# Patient Record
Sex: Female | Born: 1985 | Race: Black or African American | Hispanic: No | Marital: Single | Smoking: Current every day smoker
Health system: Southern US, Community
[De-identification: ages and names within clinical notes are randomized; demographics above are authoritative.]

## PROBLEM LIST (undated history)

## (undated) HISTORY — PX: TUBAL LIGATION: SHX77

---

## 2010-02-18 ENCOUNTER — Emergency Department (HOSPITAL_COMMUNITY): Admission: EM | Admit: 2010-02-18 | Discharge: 2010-02-18 | Payer: Self-pay | Admitting: Emergency Medicine

## 2011-03-13 LAB — URINALYSIS, ROUTINE W REFLEX MICROSCOPIC
Bilirubin Urine: NEGATIVE
Ketones, ur: NEGATIVE mg/dL
Protein, ur: NEGATIVE mg/dL
Urobilinogen, UA: 0.2 mg/dL (ref 0.0–1.0)
pH: 6 (ref 5.0–8.0)

## 2011-03-13 LAB — URINE MICROSCOPIC-ADD ON

## 2011-03-13 LAB — POCT PREGNANCY, URINE: Preg Test, Ur: POSITIVE

## 2014-01-03 ENCOUNTER — Encounter (HOSPITAL_COMMUNITY): Payer: Self-pay | Admitting: Emergency Medicine

## 2014-01-03 ENCOUNTER — Emergency Department (HOSPITAL_COMMUNITY)
Admission: EM | Admit: 2014-01-03 | Discharge: 2014-01-03 | Disposition: A | Discharge status: Home / Self Care | Payer: Medicaid Other | Attending: Emergency Medicine | Admitting: Emergency Medicine

## 2014-01-03 DIAGNOSIS — Z331 Pregnant state, incidental: Secondary | ICD-10-CM | POA: Insufficient documentation

## 2014-01-03 DIAGNOSIS — R51 Headache: Secondary | ICD-10-CM | POA: Insufficient documentation

## 2014-01-03 DIAGNOSIS — O26899 Other specified pregnancy related conditions, unspecified trimester: Secondary | ICD-10-CM

## 2014-01-03 LAB — POCT I-STAT, CHEM 8
BUN: 7 mg/dL (ref 6–23)
CALCIUM ION: 1.25 mmol/L — AB (ref 1.12–1.23)
CREATININE: 0.8 mg/dL (ref 0.50–1.10)
Chloride: 101 mEq/L (ref 96–112)
GLUCOSE: 75 mg/dL (ref 70–99)
HEMATOCRIT: 39 % (ref 36.0–46.0)
Hemoglobin: 13.3 g/dL (ref 12.0–15.0)
POTASSIUM: 3.5 meq/L — AB (ref 3.7–5.3)
SODIUM: 138 meq/L (ref 137–147)
TCO2: 25 mmol/L (ref 0–100)

## 2014-01-03 LAB — URINALYSIS, ROUTINE W REFLEX MICROSCOPIC
Bilirubin Urine: NEGATIVE
Glucose, UA: NEGATIVE mg/dL
HGB URINE DIPSTICK: NEGATIVE
KETONES UR: NEGATIVE mg/dL
Nitrite: NEGATIVE
PROTEIN: NEGATIVE mg/dL
SPECIFIC GRAVITY, URINE: 1.023 (ref 1.005–1.030)
Urobilinogen, UA: 1 mg/dL (ref 0.0–1.0)
pH: 7 (ref 5.0–8.0)

## 2014-01-03 LAB — URINE MICROSCOPIC-ADD ON

## 2014-01-03 LAB — POCT PREGNANCY, URINE: Preg Test, Ur: POSITIVE — AB

## 2014-01-03 MED ORDER — PRENATAL COMPLETE 14-0.4 MG PO TABS: 1.0000 | ORAL_TABLET | Freq: Every day | ORAL | Status: DC

## 2014-01-03 MED ORDER — ACETAMINOPHEN 325 MG PO TABS
650.0000 mg | ORAL_TABLET | Freq: Once | ORAL | Status: AC
  Administered 2014-01-03: 650 mg via ORAL
  Filled 2014-01-03: qty 2

## 2014-01-03 NOTE — ED Notes (Addendum)
Pt presents to department for evaluation of headache. Ongoing x3 days. Also states nausea/vomiting. Pt is alert and oriented x4. 8/10 pain at the time. Pt is alert and oriented x4. No signs of acute distress noted. Pt states she is x2 months pregnant.

## 2014-01-03 NOTE — Discharge Instructions (Signed)
Please continue to followup with an OB/GYN specialist for continued evaluation and care during your pregnancy. It is recommended that you use prenatal vitamins.     Headaches, Frequently Asked Questions MIGRAINE HEADACHES Q: What is migraine? What causes it? How can I treat it? A: Generally, migraine headaches begin as a dull ache. Then they develop into a constant, throbbing, and pulsating pain. You may experience pain at the temples. You may experience pain at the front or back of one or both sides of the head. The pain is usually accompanied by a combination of:  Nausea.  Vomiting.  Sensitivity to light and noise. Some people (about 15%) experience an aura (see below) before an attack. The cause of migraine is believed to be chemical reactions in the brain. Treatment for migraine may include over-the-counter or prescription medications. It may also include self-help techniques. These include relaxation training and biofeedback.  Q: What is an aura? A: About 15% of people with migraine get an "aura". This is a sign of neurological symptoms that occur before a migraine headache. You may see wavy or jagged lines, dots, or flashing lights. You might experience tunnel vision or blind spots in one or both eyes. The aura can include visual or auditory hallucinations (something imagined). It may include disruptions in smell (such as strange odors), taste or touch. Other symptoms include:  Numbness.  A "pins and needles" sensation.  Difficulty in recalling or speaking the correct word. These neurological events may last as long as 60 minutes. These symptoms will fade as the headache begins. Q: What is a trigger? A: Certain physical or environmental factors can lead to or "trigger" a migraine. These include:  Foods.  Hormonal changes.  Weather.  Stress. It is important to remember that triggers are different for everyone. To help prevent migraine attacks, you need to figure out which  triggers affect you. Keep a headache diary. This is a good way to track triggers. The diary will help you talk to your healthcare professional about your condition. Q: Does weather affect migraines? A: Bright sunshine, hot, humid conditions, and drastic changes in barometric pressure may lead to, or "trigger," a migraine attack in some people. But studies have shown that weather does not act as a trigger for everyone with migraines. Q: What is the link between migraine and hormones? A: Hormones start and regulate many of your body's functions. Hormones keep your body in balance within a constantly changing environment. The levels of hormones in your body are unbalanced at times. Examples are during menstruation, pregnancy, or menopause. That can lead to a migraine attack. In fact, about three quarters of all women with migraine report that their attacks are related to the menstrual cycle.  Q: Is there an increased risk of stroke for migraine sufferers? A: The likelihood of a migraine attack causing a stroke is very remote. That is not to say that migraine sufferers cannot have a stroke associated with their migraines. In persons under age 28, the most common associated factor for stroke is migraine headache. But over the course of a person's normal life span, the occurrence of migraine headache may actually be associated with a reduced risk of dying from cerebrovascular disease due to stroke.  Q: What are acute medications for migraine? A: Acute medications are used to treat the pain of the headache after it has started. Examples over-the-counter medications, NSAIDs, ergots, and triptans.  Q: What are the triptans? A: Triptans are the newest class of abortive medications.  They are specifically targeted to treat migraine. Triptans are vasoconstrictors. They moderate some chemical reactions in the brain. The triptans work on receptors in your brain. Triptans help to restore the balance of a neurotransmitter  called serotonin. Fluctuations in levels of serotonin are thought to be a main cause of migraine.  Q: Are over-the-counter medications for migraine effective? A: Over-the-counter, or "OTC," medications may be effective in relieving mild to moderate pain and associated symptoms of migraine. But you should see your caregiver before beginning any treatment regimen for migraine.  Q: What are preventive medications for migraine? A: Preventive medications for migraine are sometimes referred to as "prophylactic" treatments. They are used to reduce the frequency, severity, and length of migraine attacks. Examples of preventive medications include antiepileptic medications, antidepressants, beta-blockers, calcium channel blockers, and NSAIDs (nonsteroidal anti-inflammatory drugs). Q: Why are anticonvulsants used to treat migraine? A: During the past few years, there has been an increased interest in antiepileptic drugs for the prevention of migraine. They are sometimes referred to as "anticonvulsants". Both epilepsy and migraine may be caused by similar reactions in the brain.  Q: Why are antidepressants used to treat migraine? A: Antidepressants are typically used to treat people with depression. They may reduce migraine frequency by regulating chemical levels, such as serotonin, in the brain.  Q: What alternative therapies are used to treat migraine? A: The term "alternative therapies" is often used to describe treatments considered outside the scope of conventional Western medicine. Examples of alternative therapy include acupuncture, acupressure, and yoga. Another common alternative treatment is herbal therapy. Some herbs are believed to relieve headache pain. Always discuss alternative therapies with your caregiver before proceeding. Some herbal products contain arsenic and other toxins. TENSION HEADACHES Q: What is a tension-type headache? What causes it? How can I treat it? A: Tension-type headaches occur  randomly. They are often the result of temporary stress, anxiety, fatigue, or anger. Symptoms include soreness in your temples, a tightening band-like sensation around your head (a "vice-like" ache). Symptoms can also include a pulling feeling, pressure sensations, and contracting head and neck muscles. The headache begins in your forehead, temples, or the back of your head and neck. Treatment for tension-type headache may include over-the-counter or prescription medications. Treatment may also include self-help techniques such as relaxation training and biofeedback. CLUSTER HEADACHES Q: What is a cluster headache? What causes it? How can I treat it? A: Cluster headache gets its name because the attacks come in groups. The pain arrives with little, if any, warning. It is usually on one side of the head. A tearing or bloodshot eye and a runny nose on the same side of the headache may also accompany the pain. Cluster headaches are believed to be caused by chemical reactions in the brain. They have been described as the most severe and intense of any headache type. Treatment for cluster headache includes prescription medication and oxygen. SINUS HEADACHES Q: What is a sinus headache? What causes it? How can I treat it? A: When a cavity in the bones of the face and skull (a sinus) becomes inflamed, the inflammation will cause localized pain. This condition is usually the result of an allergic reaction, a tumor, or an infection. If your headache is caused by a sinus blockage, such as an infection, you will probably have a fever. An x-ray will confirm a sinus blockage. Your caregiver's treatment might include antibiotics for the infection, as well as antihistamines or decongestants.  REBOUND HEADACHES Q: What is a rebound  headache? What causes it? How can I treat it? A: A pattern of taking acute headache medications too often can lead to a condition known as "rebound headache." A pattern of taking too much  headache medication includes taking it more than 2 days per week or in excessive amounts. That means more than the label or a caregiver advises. With rebound headaches, your medications not only stop relieving pain, they actually begin to cause headaches. Doctors treat rebound headache by tapering the medication that is being overused. Sometimes your caregiver will gradually substitute a different type of treatment or medication. Stopping may be a challenge. Regularly overusing a medication increases the potential for serious side effects. Consult a caregiver if you regularly use headache medications more than 2 days per week or more than the label advises. ADDITIONAL QUESTIONS AND ANSWERS Q: What is biofeedback? A: Biofeedback is a self-help treatment. Biofeedback uses special equipment to monitor your body's involuntary physical responses. Biofeedback monitors:  Breathing.  Pulse.  Heart rate.  Temperature.  Muscle tension.  Brain activity. Biofeedback helps you refine and perfect your relaxation exercises. You learn to control the physical responses that are related to stress. Once the technique has been mastered, you do not need the equipment any more. Q: Are headaches hereditary? A: Four out of five (80%) of people that suffer report a family history of migraine. Scientists are not sure if this is genetic or a family predisposition. Despite the uncertainty, a child has a 50% chance of having migraine if one parent suffers. The child has a 75% chance if both parents suffer.  Q: Can children get headaches? A: By the time they reach high school, most young people have experienced some type of headache. Many safe and effective approaches or medications can prevent a headache from occurring or stop it after it has begun.  Q: What type of doctor should I see to diagnose and treat my headache? A: Start with your primary caregiver. Discuss his or her experience and approach to headaches. Discuss  methods of classification, diagnosis, and treatment. Your caregiver may decide to recommend you to a headache specialist, depending upon your symptoms or other physical conditions. Having diabetes, allergies, etc., may require a more comprehensive and inclusive approach to your headache. The National Head[employer] Foundation will provide, upon request, a list of [facility] physician members in your state. Document Released: 02/28/2004 Document Revised: 03/01/2012 Document Reviewed: 08/07/2008 [facility] Patient Information 2014 [place], [place].

## 2014-01-03 NOTE — ED Notes (Addendum)
Pt reports generalized weakness with HA x several days. States she just doesn't have any energy. Denies fever/chills/body aches/ cough/ V.  States she has mild nausea in the AM; reports hx of morning sickness. NAD. Rates HA as 8/10, no sensitivity to light or sound. Neuro intact. VSS.

## 2014-01-03 NOTE — ED Provider Notes (Signed)
CSN: [id number]     Arrival date & time 01/03/14  1849 History   First MD Initiated Contact with Patient 01/03/14 2109     Chief Complaint  Patient presents with  . Headache   HPI  History provided by the patient. Patient is a 28 year old female who reports currently being 2 months pregnant and presents with complaints of continued frontal headache. Patient states she has had a headache to the forehead area for the past 3-4 days. She has been taking Tylenol and reports only brief momentary relief of headache symptoms. She also finds some relief with certain positions and resting of the headache. Pain is described as mild. It is a throbbing pain. Patient does report having similar headaches with previous pregnancies but never lasting this long. She also complains of some increased general fatigue. Denies having any abdominal pains, vaginal bleeding or vaginal discharge. No urinary symptoms. No recent cough or cold symptoms. No fever, chills or sweats.    History reviewed. No pertinent past medical history. History reviewed. No pertinent past surgical history. No family history on file. History  Substance Use Topics  . Smoking status: Never Smoker   . Smokeless tobacco: Not on file  . Alcohol Use: No   OB History   Grav Para Term Preterm Abortions TAB SAB Ect Mult Living                 Review of Systems  Constitutional: Positive for fatigue.  Eyes: Negative for photophobia and visual disturbance.  Gastrointestinal: Negative for nausea, vomiting and abdominal pain.  Genitourinary: Negative for dysuria, frequency, hematuria, flank pain, vaginal bleeding and vaginal discharge.  Neurological: Positive for headaches. Negative for dizziness and light-headedness.  All other systems reviewed and are negative.    Allergies  Review of patient's allergies indicates no known allergies.  Home Medications  No current outpatient prescriptions on file. BP 136/75  Pulse 77  Temp(Src) 97.9 F  (36.6 C) (Oral)  Resp 16  SpO2 100% Physical Exam  Nursing note and vitals reviewed. Constitutional: She is oriented to person, place, and time. She appears well-developed and well-nourished. No distress.  HENT:  Head: Normocephalic and atraumatic.  Eyes: Conjunctivae and EOM are normal. Pupils are equal, round, and reactive to light.  Neck: Normal range of motion. Neck supple.  No meningeal signs  Cardiovascular: Normal rate and regular rhythm.   No murmur heard. Pulmonary/Chest: Effort normal and breath sounds normal. No respiratory distress. She has no wheezes. She has no rales.  Abdominal: Soft. There is no tenderness. There is no rigidity, no rebound, no guarding, no CVA tenderness and no tenderness at McBurney's point.  Neurological: She is alert and oriented to person, place, and time. She has normal strength. No cranial nerve deficit or sensory deficit. Gait normal.  Skin: Skin is warm and dry. No rash noted.  Psychiatric: She has a normal mood and affect. Her behavior is normal.    ED Course  Procedures   DIAGNOSTIC STUDIES: Oxygen Saturation is 98% on room air.    COORDINATION OF CARE:  Nursing notes reviewed. Vital signs reviewed. Initial pt interview and examination performed.   10:33 PM-patient seen and evaluated. Patient is well-appearing in no acute distress. Normal nonfocal neuro exam. Patient does report being 2 months pregnant. She has had headaches with her previous pregnancies. This was gradual and persistent onset. Do not suspect any concern or emerging condition causing her headache at this time.    Treatment plan initiated: Medications  acetaminophen (TYLENOL) tablet 650 mg (not administered)   Results for orders placed during the hospital encounter of 01/03/14  URINALYSIS, ROUTINE W REFLEX MICROSCOPIC      Result Value Range   Color, Urine YELLOW  YELLOW   APPearance CLOUDY (*) CLEAR   Specific Gravity, Urine 1.023  1.005 - 1.030   pH 7.0  5.0 - 8.0    Glucose, UA NEGATIVE  NEGATIVE mg/dL   Hgb urine dipstick NEGATIVE  NEGATIVE   Bilirubin Urine NEGATIVE  NEGATIVE   Ketones, ur NEGATIVE  NEGATIVE mg/dL   Protein, ur NEGATIVE  NEGATIVE mg/dL   Urobilinogen, UA 1.0  0.0 - 1.0 mg/dL   Nitrite NEGATIVE  NEGATIVE   Leukocytes, UA SMALL (*) NEGATIVE  URINE MICROSCOPIC-ADD ON      Result Value Range   Squamous Epithelial / LPF MANY (*) RARE   WBC, UA 0-2  <3 WBC/hpf   Bacteria, UA MANY (*) RARE  POCT PREGNANCY, URINE      Result Value Range   Preg Test, Ur POSITIVE (*) NEGATIVE  POCT I-STAT, CHEM 8      Result Value Range   Sodium 138  137 - 147 mEq/L   Potassium 3.5 (*) 3.7 - 5.3 mEq/L   Chloride 101  96 - 112 mEq/L   BUN 7  6 - 23 mg/dL   Creatinine, Ser 1.61  0.50 - 1.10 mg/dL   Glucose, Bld 75  70 - 99 mg/dL   Calcium, Ion 0.96 (*) 1.12 - 1.23 mmol/L   TCO2 25  0 - 100 mmol/L   Hemoglobin 13.3  12.0 - 15.0 g/dL   HCT 04.5  40.9 - 81.1 %     MDM   1. Headache in pregnancy        Angus Seller, PA-C 01/03/14 2245

## 2014-01-05 LAB — URINE CULTURE

## 2014-01-07 NOTE — ED Provider Notes (Signed)
Medical screening examination/treatment/procedure(s) were performed by non-physician practitioner and as supervising physician I was immediately available for consultation/collaboration.  EKG Interpretation   None         Paras Kreider, MD 01/07/14 0705 

## 2016-05-25 ENCOUNTER — Emergency Department (HOSPITAL_BASED_OUTPATIENT_CLINIC_OR_DEPARTMENT_OTHER)
Admission: EM | Admit: 2016-05-25 | Discharge: 2016-05-25 | Disposition: A | Discharge status: Home / Self Care | Payer: Self-pay | Attending: Emergency Medicine | Admitting: Emergency Medicine

## 2016-05-25 ENCOUNTER — Emergency Department (HOSPITAL_BASED_OUTPATIENT_CLINIC_OR_DEPARTMENT_OTHER): Payer: Self-pay

## 2016-05-25 ENCOUNTER — Encounter (HOSPITAL_BASED_OUTPATIENT_CLINIC_OR_DEPARTMENT_OTHER): Payer: Self-pay | Admitting: *Deleted

## 2016-05-25 DIAGNOSIS — M549 Dorsalgia, unspecified: Secondary | ICD-10-CM

## 2016-05-25 DIAGNOSIS — Z79899 Other long term (current) drug therapy: Secondary | ICD-10-CM | POA: Insufficient documentation

## 2016-05-25 DIAGNOSIS — F17200 Nicotine dependence, unspecified, uncomplicated: Secondary | ICD-10-CM | POA: Insufficient documentation

## 2016-05-25 DIAGNOSIS — M546 Pain in thoracic spine: Secondary | ICD-10-CM | POA: Insufficient documentation

## 2016-05-25 DIAGNOSIS — R079 Chest pain, unspecified: Secondary | ICD-10-CM | POA: Insufficient documentation

## 2016-05-25 LAB — URINALYSIS, ROUTINE W REFLEX MICROSCOPIC
BILIRUBIN URINE: NEGATIVE
Glucose, UA: NEGATIVE mg/dL
Hgb urine dipstick: NEGATIVE
KETONES UR: NEGATIVE mg/dL
LEUKOCYTES UA: NEGATIVE
NITRITE: NEGATIVE
PH: 6 (ref 5.0–8.0)
PROTEIN: NEGATIVE mg/dL
Specific Gravity, Urine: 1.023 (ref 1.005–1.030)

## 2016-05-25 LAB — BASIC METABOLIC PANEL
Anion gap: 6 (ref 5–15)
BUN: 11 mg/dL (ref 6–20)
CHLORIDE: 109 mmol/L (ref 101–111)
CO2: 22 mmol/L (ref 22–32)
CREATININE: 0.75 mg/dL (ref 0.44–1.00)
Calcium: 8.3 mg/dL — ABNORMAL LOW (ref 8.9–10.3)
GFR calc Af Amer: >60 mL/min (ref >60)
GFR calc non Af Amer: >60 mL/min (ref >60)
GLUCOSE: 95 mg/dL (ref 65–99)
POTASSIUM: 3.6 mmol/L (ref 3.5–5.1)
SODIUM: 137 mmol/L (ref 135–145)

## 2016-05-25 LAB — CBC WITH DIFFERENTIAL/PLATELET
BASOS ABS: 0 10*3/uL (ref 0.0–0.1)
BASOS PCT: 0 %
EOS ABS: 0.1 10*3/uL (ref 0.0–0.7)
EOS PCT: 1 %
HCT: 35.7 % — ABNORMAL LOW (ref 36.0–46.0)
Hemoglobin: 11.4 g/dL — ABNORMAL LOW (ref 12.0–15.0)
Lymphocytes Relative: 26 %
Lymphs Abs: 1.6 10*3/uL (ref 0.7–4.0)
MCH: 29.4 pg (ref 26.0–34.0)
MCHC: 31.9 g/dL (ref 30.0–36.0)
MCV: 92 fL (ref 78.0–100.0)
MONO ABS: 0.8 10*3/uL (ref 0.1–1.0)
Monocytes Relative: 13 %
Neutro Abs: 3.9 10*3/uL (ref 1.7–7.7)
Neutrophils Relative %: 60 %
PLATELETS: 222 10*3/uL (ref 150–400)
RBC: 3.88 MIL/uL (ref 3.87–5.11)
RDW: 13.9 % (ref 11.5–15.5)
WBC: 6.4 10*3/uL (ref 4.0–10.5)

## 2016-05-25 LAB — TROPONIN I

## 2016-05-25 LAB — PREGNANCY, URINE: PREG TEST UR: NEGATIVE

## 2016-05-25 MED ORDER — ACETAMINOPHEN 325 MG PO TABS
650.0000 mg | ORAL_TABLET | Freq: Once | ORAL | Status: AC
  Administered 2016-05-25: 650 mg via ORAL
  Filled 2016-05-25: qty 2

## 2016-05-25 MED ORDER — NAPROXEN 250 MG PO TABS: 250.0000 mg | ORAL_TABLET | Freq: Two times a day (BID) | ORAL | Status: AC

## 2016-05-25 NOTE — ED Provider Notes (Signed)
CSN: [id number]     Arrival date & time 05/25/16  1030 History   First MD Initiated Contact with Patient 05/25/16 1044     Chief Complaint  Patient presents with  . Back Pain    Kari Reed is a 30 y.o. female who presents to the ED complaining of left upper back pain that radiates to her left chest since last night. The patient reports she began having pain around 11 PM last night. She reports her pain has been constant since then. She currently complains of 7 out of 10 pain that is worse with movement of her left arm as well as laying on her right side. She denies any shortness of breath. She reports taking aspirin last night for treatment of her symptoms without relief. She has had nothing for treatment of her symptoms today. She denies any falls or trauma to her back. No personal or close family history of MI, PE or DVT. No recent long travel. No history of hyperlipidemia or hypertension. She is a current smoker. She denies fevers, coughing, shortness of breath, palpitations, leg pain, leg swelling, abdominal pain, nausea, vomiting, diarrhea, urinary symptoms, rashes, falls or trauma to her back.  Patient is a 30 y.o. female presenting with back pain. The history is provided by the patient. No language interpreter was used.  Back Pain Associated symptoms: chest pain   Associated symptoms: no abdominal pain, no dysuria, no fever, no headaches, no numbness and no weakness     History reviewed. No pertinent past medical history. Past Surgical History  Procedure Laterality Date  . Cesarean section  2011, 2012, 2015   No family history on file. Social History  Substance Use Topics  . Smoking status: Current Every Day Smoker  . Smokeless tobacco: Never Used  . Alcohol Use: No   OB History    No data available     Review of Systems  Constitutional: Negative for fever and chills.  HENT: Negative for congestion and sore throat.   Eyes: Negative for visual disturbance.  Respiratory:  Negative for cough, shortness of breath and wheezing.   Cardiovascular: Positive for chest pain. Negative for palpitations and leg swelling.  Gastrointestinal: Negative for nausea, vomiting, abdominal pain and diarrhea.  Genitourinary: Negative for dysuria, urgency, frequency, hematuria, decreased urine volume and difficulty urinating.  Musculoskeletal: Positive for back pain. Negative for gait problem and neck pain.  Skin: Negative for rash.  Neurological: Negative for dizziness, weakness, light-headedness, numbness and headaches.      Allergies  Review of patient's allergies indicates no known allergies.  Home Medications   Prior to Admission medications   Medication Sig Start Date End Date Taking? Authorizing Provider  Prenatal Vit-Fe Fumarate-FA (PRENATAL COMPLETE) 14-0.4 MG TABS Take 1 tablet by mouth daily. 01/03/14  Yes Peter Dammen, PA-C  naproxen (NAPROSYN) 250 MG tablet Take 1 tablet (250 mg total) by mouth 2 (two) times daily with a meal. 05/25/16   Everlene Farrier, PA-C   BP 136/83 mmHg  Pulse 88  Temp(Src) 98.5 F (36.9 C) (Oral)  Resp 20  Ht  (1.651 m)  Wt 84.823 kg  BMI 31.12 kg/m2  SpO2 100%  LMP 04/30/2016 (Exact Date) Physical Exam  Constitutional: She appears well-developed and well-nourished. No distress.  Nontoxic appearing.  HENT:  Head: Normocephalic and atraumatic.  Mouth/Throat: Oropharynx is clear and moist.  Eyes: Conjunctivae are normal. Pupils are equal, round, and reactive to light. Right eye exhibits no discharge. Left eye exhibits no discharge.  Neck: Normal range of motion. Neck supple. No JVD present. No tracheal deviation present.  Cardiovascular: Normal rate, regular rhythm, normal heart sounds and intact distal pulses.  Exam reveals no gallop and no friction rub.   No murmur heard. Bilateral radial, posterior tibialis and dorsalis pedis pulses are intact.    Pulmonary/Chest: Effort normal and breath sounds normal. No stridor. No  respiratory distress. She has no wheezes. She has no rales. She exhibits no tenderness.  Lungs are clear to auscultation bilaterally. No chest wall tenderness to palpation.  Abdominal: Soft. There is no tenderness. There is no guarding.  Abdomen is soft and nontender to palpation. No CVA or flank tenderness.  Musculoskeletal: Normal range of motion. She exhibits tenderness. She exhibits no edema.  Patient has mild tenderness to her left lateral upper back. No overlying skin changes. No back edema, deformity, ecchymosis or erythema. No midline neck or back tenderness. Good strength in her bilateral upper and lower extremities. No lower extremity edema or tenderness.   Lymphadenopathy:    She has no cervical adenopathy.  Neurological: She is alert. Coordination normal.  Sensation is intact in her bilateral upper and lower extremities. Normal gait.   Skin: Skin is warm and dry. No rash noted. She is not diaphoretic. No erythema. No pallor.  Psychiatric: She has a normal mood and affect. Her behavior is normal.  Nursing note and vitals reviewed.   ED Course  Procedures (including critical care time) Labs Review Labs Reviewed  BASIC METABOLIC PANEL - Abnormal; Notable for the following:    Calcium 8.3 (*)    All other components within normal limits  CBC WITH DIFFERENTIAL/PLATELET - Abnormal; Notable for the following:    Hemoglobin 11.4 (*)    HCT 35.7 (*)    All other components within normal limits  TROPONIN I  URINALYSIS, ROUTINE W REFLEX MICROSCOPIC (NOT AT [facility])  PREGNANCY, URINE    Imaging Review Dg Chest 2 View  05/25/2016  CLINICAL DATA:  Left side back and chest pain since last night. EXAM: CHEST  2 VIEW COMPARISON:  None. FINDINGS: The heart size and mediastinal contours are within normal limits. Both lungs are clear. The visualized skeletal structures are unremarkable. IMPRESSION: No active cardiopulmonary disease. Electronically Signed   By: Charlett Nose M.D.   On: 05/25/2016  11:52   I have personally reviewed and evaluated these images and lab results as part of my medical decision-making.   EKG Interpretation   Date/Time:  Sunday May 25 2016 11:21:18 EDT Ventricular Rate:  78 PR Interval:  208 QRS Duration: 90 QT Interval:  362 QTC Calculation: 412 R Axis:   29 Text Interpretation:  Sinus rhythm Borderline prolonged PR interval No  prior EKG. No acute ischemic changes.  Confirmed by LIU MD, Annabelle Harman [phone])  on 05/25/2016 11:28:22 AM Also confirmed by LIU MD, DANA [phone]), editor  Stout CT, Jola Babinski [phone])  on 05/25/2016 11:36:53 AM      Filed Vitals:   05/25/16 1036  BP: 136/83  Pulse: 88  Temp: 98.5 F (36.9 C)  TempSrc: Oral  Resp: 20  Height: 5\' 5"  (1.651 m)  Weight: 84.823 kg  SpO2: 100%     MDM   Meds given in ED:  Medications  acetaminophen (TYLENOL) tablet 650 mg (not administered)    New Prescriptions   NAPROXEN (NAPROSYN) 250 MG TABLET    Take 1 tablet (250 mg total) by mouth 2 (two) times daily with a meal.    Final diagnoses:  Upper back pain on left side    This is a 30 y.o. female who presents to the ED [id number] of left upper back pain that radiates to her left chest since last night. The patient reports she began having pain around 11 PM last night. She reports her pain has been constant since then. She currently complains of 7 out of 10 pain that is worse with movement of her left arm as well as laying on her right side. She denies any shortness of breath. She reports taking aspirin last night for treatment of her symptoms without relief. She has had nothing for treatment of her symptoms today. She denies any falls or trauma to her back.  On exam the patient is afebrile nontoxic appearing. Her heart is regular rate and rhythm. Good pulses. Lungs clear to auscultation bilaterally. She has some tenderness to her left lateral upper back. No overlying skin changes. No midline back tenderness. No neurological deficits.  Pregnancy  test is negative. Urinalysis is negative for infection. BMP is unremarkable. CBC is remarkable for hemoglobin of 11.4. Troponin is not elevated. EKG shows sinus rhythm with borderline prolonged PR interval. No ischemic changes. No STEMI.  Patient has had pain since 11 PM last night. I see no need to check a troponin. I have low suspicion for cardiac origin of this pain. She has a HEART score of 1. She is PERC negative. Wells negative. I believe this is musculoskeletal pain.  Will discharge with prescription for naproxen discussed back exercises. I encouraged her to follow-up closely with primary care and possibly seek referral to cardiology if her pain continues. I discussed strict and specific return precautions related to chest pain and back pain.I advised the patient to follow-up with their primary care provider this week. I advised the patient to return to the emergency department with new or worsening symptoms or new concerns. The patient verbalized understanding and agreement with plan.      Everlene FarrierWilliam Ashmi Blas, PA-C 05/25/16 1251  Lavera Guiseana Duo Liu, MD 05/26/16 [phone]

## 2016-05-25 NOTE — ED Notes (Signed)
PA-C at bedside 

## 2016-05-25 NOTE — Discharge Instructions (Signed)
Back Pain, Adult °Back pain is very common in adults. The cause of back pain is rarely dangerous and the pain often gets better over time. The cause of your back pain may not be known. Some common causes of back pain include: °1. Strain of the muscles or ligaments supporting the spine. °2. Wear and tear (degeneration) of the spinal disks. °3. Arthritis. °4. Direct injury to the back. °For many people, back pain may return. Since back pain is rarely dangerous, most people can learn to manage this condition on their own. °HOME CARE INSTRUCTIONS °Watch your back pain for any changes. The following actions may help to lessen any discomfort you are feeling: °1. Remain active. It is stressful on your back to sit or stand in one place for long periods of time. Do not sit, drive, or stand in one place for more than 30 minutes at a time. Take short walks on even surfaces as soon as you are able. Try to increase the length of time you walk each day. °2. Exercise regularly as directed by your health care provider. Exercise helps your back heal faster. It also helps avoid future injury by keeping your muscles strong and flexible. °3. Do not stay in bed. Resting more than 1-2 days can delay your recovery. °4. Pay attention to your body when you bend and lift. The most comfortable positions are those that put less stress on your recovering back. Always use proper lifting techniques, including: °1. Bending your knees. °2. Keeping the load close to your body. °3. Avoiding twisting. °5. Find a comfortable position to sleep. Use a firm mattress and lie on your side with your knees slightly bent. If you lie on your back, put a pillow under your knees. °6. Avoid feeling anxious or stressed. Stress increases muscle tension and can worsen back pain. It is important to recognize when you are anxious or stressed and learn ways to manage it, such as with exercise. °7. Take medicines only as directed by your health care provider.  Over-the-counter medicines to reduce pain and inflammation are often the most helpful. Your health care provider may prescribe muscle relaxant drugs. These medicines help dull your pain so you can more quickly return to your normal activities and healthy exercise. °8. Apply ice to the injured area: °1. Put ice in a plastic bag. °2. Place a towel between your skin and the bag. °3. Leave the ice on for 20 minutes, 2-3 times a day for the first 2-3 days. After that, ice and heat may be alternated to reduce pain and spasms. °9. Maintain a healthy weight. Excess weight puts extra stress on your back and makes it difficult to maintain good posture. °SEEK MEDICAL CARE IF: °1. You have pain that is not relieved with rest or medicine. °2. You have increasing pain going down into the legs or buttocks. °3. You have pain that does not improve in one week. °4. You have night pain. °5. You lose weight. °6. You have a fever or chills. °SEEK IMMEDIATE MEDICAL CARE IF:  °1. You develop new bowel or bladder control problems. °2. You have unusual weakness or numbness in your arms or legs. °3. You develop nausea or vomiting. °4. You develop abdominal pain. °5. You feel faint. °  °This information is not intended to replace advice given to you by your health care provider. Make sure you discuss any questions you have with your health care provider. °  °Document Released: 12/08/2005 Document Revised: 12/29/2014 Document Reviewed: 04/11/2014 °Elsevier Interactive Patient Education ©2016 [employer]   Inc[employer] ° °Back Exercises °The following exercises strengthen the muscles that help to support the back. They also help to keep the lower back flexible. Doing these exercises can help to prevent back pain or lessen existing pain. °If you have back pain or discomfort, try doing these exercises 2-3 times each day or as told by your health care provider. When the pain goes away, do them once each day, but increase the number of times that you repeat the  steps for each exercise (do more repetitions). If you do not have back pain or discomfort, do these exercises once each day or as told by your health care provider. °EXERCISES °Single Knee to Chest °Repeat these steps 3-5 times for each leg: °5. Lie on your back on a firm bed or the floor with your legs extended. °6. Bring one knee to your chest. Your other leg should stay extended and in contact with the floor. °7. Hold your knee in place by grabbing your knee or thigh. °8. Pull on your knee until you feel a gentle stretch in your lower back. °9. Hold the stretch for 10-30 seconds. °10. Slowly release and straighten your leg. °Pelvic Tilt °Repeat these steps 5-10 times: °10. Lie on your back on a firm bed or the floor with your legs extended. °11. Bend your knees so they are pointing toward the ceiling and your feet are flat on the floor. °12. Tighten your lower abdominal muscles to press your lower back against the floor. This motion will tilt your pelvis so your tailbone points up toward the ceiling instead of pointing to your feet or the floor. °13. With gentle tension and even breathing, hold this position for 5-10 seconds. °Cat-Cow °Repeat these steps until your lower back becomes more flexible: °7. Get into a hands-and-knees position on a firm surface. Keep your hands under your shoulders, and keep your knees under your hips. You may place padding under your knees for comfort. °8. Let your head hang down, and point your tailbone toward the floor so your lower back becomes rounded like the back of a cat. °9. Hold this position for 5 seconds. °10. Slowly lift your head and point your tailbone up toward the ceiling so your back forms a sagging arch like the back of a cow. °11. Hold this position for 5 seconds. °Press-Ups °Repeat these steps 5-10 times: °6. Lie on your abdomen (face-down) on the floor. °7. Place your palms near your head, about shoulder-width apart. °8. While you keep your back as relaxed as  possible and keep your hips on the floor, slowly straighten your arms to raise the top half of your body and lift your shoulders. Do not use your back muscles to raise your upper torso. You may adjust the placement of your hands to make yourself more comfortable. °9. Hold this position for 5 seconds while you keep your back relaxed. °10. Slowly return to lying flat on the floor. °Bridges °Repeat these steps 10 times: °1. Lie on your back on a firm surface. °2. Bend your knees so they are pointing toward the ceiling and your feet are flat on the floor. °3. Tighten your buttocks muscles and lift your buttocks off of the floor until your waist is at almost the same height as your knees. You should feel the muscles working in your buttocks and the back of your thighs. If you do not feel these muscles, slide your feet 1-2 inches farther away from your buttocks. °4. Hold this   position for 3-5 seconds. °5. Slowly lower your hips to the starting position, and allow your buttocks muscles to relax completely. °If this exercise is too easy, try doing it with your arms crossed over your chest. °Abdominal Crunches °Repeat these steps 5-10 times: °1. Lie on your back on a firm bed or the floor with your legs extended. °2. Bend your knees so they are pointing toward the ceiling and your feet are flat on the floor. °3. Cross your arms over your chest. °4. Tip your chin slightly toward your chest without bending your neck. °5. Tighten your abdominal muscles and slowly raise your trunk (torso) high enough to lift your shoulder blades a tiny bit off of the floor. Avoid raising your torso higher than that, because it can put too much stress on your low back and it does not help to strengthen your abdominal muscles. °6. Slowly return to your starting position. °Back Lifts °Repeat these steps 5-10 times: °1. Lie on your abdomen (face-down) with your arms at your sides, and rest your forehead on the floor. °2. Tighten the muscles in your  legs and your buttocks. °3. Slowly lift your chest off of the floor while you keep your hips pressed to the floor. Keep the back of your head in line with the curve in your back. Your eyes should be looking at the floor. °4. Hold this position for 3-5 seconds. °5. Slowly return to your starting position. °SEEK MEDICAL CARE IF: °· Your back pain or discomfort gets much worse when you do an exercise. °· Your back pain or discomfort does not lessen within 2 hours after you exercise. °If you have any of these problems, stop doing these exercises right away. Do not do them again unless your health care provider says that you can. °SEEK IMMEDIATE MEDICAL CARE IF: °· You develop sudden, severe back pain. If this happens, stop doing the exercises right away. Do not do them again unless your health care provider says that you can. °  °This information is not intended to replace advice given to you by your health care provider. Make sure you discuss any questions you have with your health care provider. °  °Document Released: 01/15/2005 Document Revised: 08/29/2015 Document Reviewed: 02/01/2015 °Elsevier Interactive Patient Education ©2016 Elsevier Inc. ° °

## 2016-05-25 NOTE — ED Notes (Signed)
Pt reports around 2300 last night she developed L-sided back pain about mid-way down her back that radiates to L rib cage/breast; reports pain worsens with lying down. Reports it's worse with movement as well; denies sob, n/v/d, fever. States she took aspirin last night for pain and that it was ineffective.

## 2016-12-12 ENCOUNTER — Emergency Department (HOSPITAL_BASED_OUTPATIENT_CLINIC_OR_DEPARTMENT_OTHER): Payer: Self-pay

## 2016-12-12 ENCOUNTER — Encounter (HOSPITAL_BASED_OUTPATIENT_CLINIC_OR_DEPARTMENT_OTHER): Payer: Self-pay | Admitting: *Deleted

## 2016-12-12 ENCOUNTER — Emergency Department (HOSPITAL_BASED_OUTPATIENT_CLINIC_OR_DEPARTMENT_OTHER)
Admission: EM | Admit: 2016-12-12 | Discharge: 2016-12-12 | Disposition: A | Discharge status: Home / Self Care | Payer: Self-pay | Attending: Emergency Medicine | Admitting: Emergency Medicine

## 2016-12-12 DIAGNOSIS — Z87891 Personal history of nicotine dependence: Secondary | ICD-10-CM | POA: Insufficient documentation

## 2016-12-12 DIAGNOSIS — O209 Hemorrhage in early pregnancy, unspecified: Secondary | ICD-10-CM | POA: Insufficient documentation

## 2016-12-12 DIAGNOSIS — O4690 Antepartum hemorrhage, unspecified, unspecified trimester: Secondary | ICD-10-CM

## 2016-12-12 DIAGNOSIS — N898 Other specified noninflammatory disorders of vagina: Secondary | ICD-10-CM | POA: Insufficient documentation

## 2016-12-12 DIAGNOSIS — Z3A01 Less than 8 weeks gestation of pregnancy: Secondary | ICD-10-CM | POA: Insufficient documentation

## 2016-12-12 LAB — URINALYSIS, ROUTINE W REFLEX MICROSCOPIC
BILIRUBIN URINE: NEGATIVE
Glucose, UA: 100 mg/dL — AB
Ketones, ur: NEGATIVE mg/dL
Leukocytes, UA: NEGATIVE
NITRITE: NEGATIVE
PH: 6 (ref 5.0–8.0)
Protein, ur: NEGATIVE mg/dL
SPECIFIC GRAVITY, URINE: 1.036 — AB (ref 1.005–1.030)

## 2016-12-12 LAB — URINALYSIS, MICROSCOPIC (REFLEX): WBC UA: NONE SEEN WBC/hpf (ref 0–5)

## 2016-12-12 LAB — PREGNANCY, URINE: PREG TEST UR: POSITIVE — AB

## 2016-12-12 LAB — WET PREP, GENITAL
Sperm: NONE SEEN
Yeast Wet Prep HPF POC: NONE SEEN

## 2016-12-12 LAB — HCG, QUANTITATIVE, PREGNANCY: HCG, BETA CHAIN, QUANT, S: 116965 m[IU]/mL — AB (ref <5)

## 2016-12-12 MED ORDER — PRENATAL COMPLETE 14-0.4 MG PO TABS: 1.0000 | ORAL_TABLET | Freq: Every day | ORAL | 0 refills | Status: DC

## 2016-12-12 NOTE — ED Triage Notes (Signed)
Pt c/o sm amt vaginal bleeding "spotting" x 1 day @ months preg , no prenatal care

## 2016-12-12 NOTE — ED Provider Notes (Signed)
[facility] Provider Note   CSN: [id number] Arrival date & time: 12/12/16  1640 By signing my name below, I, Bridgette HabermannMaria Tan, attest that this documentation has been prepared under the direction and in the presence of Cheron SchaumannLeslie Francessca Friis, [place]PA-C. Electronically Signed: Bridgette HabermannMaria Tan, ED Scribe. 12/12/16. 7:40 PM.  History   Chief Complaint Chief Complaint  Patient presents with  . Vaginal Bleeding   The history is provided by the patient. No language interpreter was used.   HPI Comments: Kari Reed is a 30 y.o. female [id number]) who is [redacted] weeks pregnant with no pertinent PMHx, who presents to the Emergency Department complaining of light red vaginal bleeding onset one day ago. Pt describes her bleeding as "spotting". No alleviating factors noted. Pt does not have prenatal care at this time. Her LNMP was 10/20/16. She denies fever, chills, abdominal pain, or any other associated symptoms. Pt is in no pain at this time.   History reviewed. No pertinent past medical history.  There are no active problems to display for this patient.   Past Surgical History:  Procedure Laterality Date  . CESAREAN SECTION  2011, 2012, 2015    OB History    No data available       Home Medications    Prior to Admission medications   Medication Sig Start Date End Date Taking? Authorizing Provider  naproxen (NAPROSYN) 250 MG tablet Take 1 tablet (250 mg total) by mouth 2 (two) times daily with a meal. 05/25/16   Everlene FarrierWilliam Dansie, PA-C  Prenatal Vit-Fe Fumarate-FA (PRENATAL COMPLETE) 14-0.4 MG TABS Take 1 tablet by mouth daily. 01/03/14   Ivonne AndrewPeter Dammen, PA-C    Family History History reviewed. No pertinent family history.  Social History Social History  Substance Use Topics  . Smoking status: Former Games developermoker  . Smokeless tobacco: Never Used  . Alcohol use No     Allergies   Patient has no known allergies.   Review of Systems Review of Systems  Constitutional: Negative for chills and fever.    Gastrointestinal: Negative for abdominal pain.  Genitourinary: Positive for vaginal bleeding.  All other systems reviewed and are negative.    Physical Exam Updated Vital Signs Ht 5\' 5"  (1.651 m)   Wt 196 lb (88.9 kg)   LMP 10/20/2016   BMI 32.62 kg/m   Physical Exam  Constitutional: She appears well-developed and well-nourished.  HENT:  Head: Normocephalic.  Eyes: Conjunctivae are normal.  Cardiovascular: Normal rate.   Pulmonary/Chest: Effort normal. No respiratory distress.  Abdominal: She exhibits no distension.  Genitourinary: Uterus normal.  Genitourinary Comments: Chaperone present throughout entire exam. Small amount of blood  Musculoskeletal: Normal range of motion.  Neurological: She is alert.  Skin: Skin is warm and dry.  Psychiatric: She has a normal mood and affect. Her behavior is normal.  Nursing note and vitals reviewed.    ED Treatments / Results  COORDINATION OF CARE: 7:39 PM Discussed treatment plan with pt at bedside which includes pelvic exam and ultrasound and pt agreed to plan.  Labs (all labs ordered are listed, but only abnormal results are displayed) Labs Reviewed  URINALYSIS, ROUTINE W REFLEX MICROSCOPIC - Abnormal; Notable for the following:       Result Value   Specific Gravity, Urine 1.036 (*)    Glucose, UA 100 (*)    Hgb urine dipstick TRACE (*)    All other components within normal limits  PREGNANCY, URINE - Abnormal; Notable for the following:    Preg Test,  Ur POSITIVE (*)    All other components within normal limits  URINALYSIS, MICROSCOPIC (REFLEX) - Abnormal; Notable for the following:    Bacteria, UA RARE (*)    Squamous Epithelial / LPF 0-5 (*)    All other components within normal limits    EKG  EKG Interpretation None       Radiology No results found.  Procedures Procedures (including critical care time)  Medications Ordered in ED Medications - No data to display   Initial Impression / Assessment and  Plan / ED Course  I have reviewed the triage vital signs and the nursing notes.  Pertinent labs & imaging results that were available during my care of the patient were reviewed by me and considered in my medical decision making (see chart for details).  Clinical Course       Final Clinical Impressions(s) / ED Diagnoses   Final diagnoses:  Vaginal bleeding in pregnancy    New Prescriptions New Prescriptions   No medications on file   Meds ordered this encounter  Medications  . Prenatal Vit-Fe Fumarate-FA (PRENATAL COMPLETE) 14-0.4 MG TABS    Sig: Take 1 tablet by mouth daily.    Dispense:  60 each    Refill:  0    Order Specific Question:   Supervising Provider    Answer:   Cathren LaineSTEINL, KEVIN [[id number]]   An After Visit Summary was printed and given to the patient.   Lonia SkinnerLeslie K BourgSofia, PA-C 12/13/16 [id number]    Pricilla LovelessScott Goldston, MD 12/15/16 [phone]

## 2016-12-13 ENCOUNTER — Emergency Department (HOSPITAL_BASED_OUTPATIENT_CLINIC_OR_DEPARTMENT_OTHER)
Admission: EM | Admit: 2016-12-13 | Discharge: 2016-12-14 | Disposition: A | Discharge status: Home / Self Care | Payer: Medicaid Other | Attending: Emergency Medicine | Admitting: Emergency Medicine

## 2016-12-13 ENCOUNTER — Encounter (HOSPITAL_BASED_OUTPATIENT_CLINIC_OR_DEPARTMENT_OTHER): Payer: Self-pay | Admitting: Emergency Medicine

## 2016-12-13 DIAGNOSIS — O200 Threatened abortion: Secondary | ICD-10-CM | POA: Insufficient documentation

## 2016-12-13 DIAGNOSIS — Z87891 Personal history of nicotine dependence: Secondary | ICD-10-CM | POA: Insufficient documentation

## 2016-12-13 DIAGNOSIS — Z3A01 Less than 8 weeks gestation of pregnancy: Secondary | ICD-10-CM | POA: Insufficient documentation

## 2016-12-13 NOTE — ED Notes (Addendum)
Pt states that she began having vaginal spotting x 3 days ago was seen in the ER 12/22 and a pelvic exam performed. Pt reports that she began having heavier bleeding x 30 min ago and that she passed 2 clots 1st  the size of 50 cent piece and the second the size of a penny. Denies abd cramping

## 2016-12-13 NOTE — ED Provider Notes (Signed)
[facility] Provider Note   CSN: [id number] Arrival date & time: 12/13/16  2213  By signing my name below, I, Modena Jansky, attest that this documentation has been prepared under the direction and in the presence of Shon Baton, MD . Electronically Signed: Modena Jansky, Scribe. 12/13/2016. 11:20 PM.  History   Chief Complaint Chief Complaint  Patient presents with  . Vaginal Bleeding   The history is provided by the patient. No language interpreter was used.   HPI Comments: Kari Reed is a 30 y.o. female who is [redacted] weeks pregnant and presents to the Emergency Department complaining of vaginal bleeding that started 3 days ago. She states she first noticed spotting that is now dark red with clots. She had an ultrasound done yesterday for light spotting. She reports associated abdominal pressure. She states she is G5 P3 (one prior [facility]). She denies any nausea, vomiting, diarrhea, or other complaints.   History reviewed. No pertinent past medical history.  There are no active problems to display for this patient.   Past Surgical History:  Procedure Laterality Date  . CESAREAN SECTION  2011, 2012, 2015    OB History    Gravida Para Term Preterm AB Living   1             SAB TAB Ectopic Multiple Live Births                   Home Medications    Prior to Admission medications   Medication Sig Start Date End Date Taking? Authorizing Provider  naproxen (NAPROSYN) 250 MG tablet Take 1 tablet (250 mg total) by mouth 2 (two) times daily with a meal. 05/25/16   Everlene Farrier, PA-C  Prenatal Vit-Fe Fumarate-FA (PRENATAL COMPLETE) 14-0.4 MG TABS Take 1 tablet by mouth daily. 12/12/16   Elson Areas, PA-C    Family History History reviewed. No pertinent family history.  Social History Social History  Substance Use Topics  . Smoking status: Former Games developer  . Smokeless tobacco: Never Used  . Alcohol use No     Allergies   Patient has no known  allergies.   Review of Systems Review of Systems  Respiratory: Negative for shortness of breath.   Cardiovascular: Negative for chest pain.  Gastrointestinal: Negative for diarrhea, nausea and vomiting.  Genitourinary: Positive for vaginal bleeding.  All other systems reviewed and are negative.    Physical Exam Updated Vital Signs BP 110/67 (BP Location: Right Arm)   Pulse 95   Temp 98.5 F (36.9 C) (Oral)   Resp 19   LMP 10/20/2016   SpO2 100%   Physical Exam  Constitutional: She is oriented to person, place, and time. She appears well-developed and well-nourished.  HENT:  Head: Normocephalic and atraumatic.  Cardiovascular: Normal rate, regular rhythm and normal heart sounds.   Pulmonary/Chest: Effort normal and breath sounds normal. No respiratory distress. She has no wheezes.  Abdominal: Soft. Bowel sounds are normal.  Genitourinary:  Genitourinary Comments: Moderate amount of vaginal bleeding noted, cervical os closed, no products of conception or large clots noted  Neurological: She is alert and oriented to person, place, and time.  Skin: Skin is warm and dry.  Psychiatric: She has a normal mood and affect.  Nursing note and vitals reviewed.    ED Treatments / Results  DIAGNOSTIC STUDIES: Oxygen Saturation is 100% on RA, normal by my interpretation.    COORDINATION OF CARE: 11:24 PM- Pt advised of plan for treatment and  pt agrees.  Labs (all labs ordered are listed, but only abnormal results are displayed) Labs Reviewed - No data to display  EKG  EKG Interpretation None       Radiology [place] Ob Comp Less 14 Wks  Result Date: 12/12/2016 CLINICAL DATA:  Vaginal spotting for 1 day with midline pressure EXAM: OBSTETRIC <14 WK US AND TRANSVAGINAL OB US TECHNIQUE: Both transabdominal and transvaginal ultrasound examinations were performed for complete evaluation of the gestation as well as the maternal uterus, adnexal regions, and pelvic cul-de-sac.  Transvaginal technique was performed to assess early pregnancy. COMPARISON:  None. FINDINGS: Intrauterine gestational sac: Single intrauterine gestation Yolk sac:  Visualized Embryo:  Visualized Cardiac Activity: Visualized Heart Rate: 158  bpm CRL:  11.5  mm   7 w   2 d                  US EDC: 07/29/2017 Subchorionic hemorrhage:  None visualized. Maternal uterus/adnexae: Left ovary is within normal limits and measures 2.4 x 4.6 x 2 cm. The right ovary measures 3.3 x 5.1 x 3.3 cm. Sonolucent cyst in the right ovary measuring 2.5 x 2.9 x 2.9 cm. No free fluid. IMPRESSION: Single intrauterine gestation with fetal cardiac activity as above. Otherwise negative examination. Electronically Signed   By: Jasmine PangKim  Fujinaga M.D.   On: 12/12/2016 20:52   [place] Ob Transvaginal  Result Date: 12/12/2016 CLINICAL DATA:  Vaginal spotting for 1 day with midline pressure EXAM: OBSTETRIC <14 WK US AND TRANSVAGINAL OB US TECHNIQUE: Both transabdominal and transvaginal ultrasound examinations were performed for complete evaluation of the gestation as well as the maternal uterus, adnexal regions, and pelvic cul-de-sac. Transvaginal technique was performed to assess early pregnancy. COMPARISON:  None. FINDINGS: Intrauterine gestational sac: Single intrauterine gestation Yolk sac:  Visualized Embryo:  Visualized Cardiac Activity: Visualized Heart Rate: 158  bpm CRL:  11.5  mm   7 w   2 d                  US EDC: 07/29/2017 Subchorionic hemorrhage:  None visualized. Maternal uterus/adnexae: Left ovary is within normal limits and measures 2.4 x 4.6 x 2 cm. The right ovary measures 3.3 x 5.1 x 3.3 cm. Sonolucent cyst in the right ovary measuring 2.5 x 2.9 x 2.9 cm. No free fluid. IMPRESSION: Single intrauterine gestation with fetal cardiac activity as above. Otherwise negative examination. Electronically Signed   By: Jasmine PangKim  Fujinaga M.D.   On: 12/12/2016 20:52    Procedures Procedures (including critical care time)  Medications Ordered in  ED Medications - No data to display   Initial Impression / Assessment and Plan / ED Course  I have reviewed the triage vital signs and the nursing notes.  Pertinent labs & imaging results that were available during my care of the patient were reviewed by me and considered in my medical decision making (see chart for details).  Clinical Course     Patient presents with continued worsening vaginal bleeding. Ultrasound yesterday confirmed intrauterine pregnancy with fetal heart activity. Reports increasing bleeding. She is otherwise nontoxic and vital signs are reassuring. No access to formal ultrasound. Will bring patient back later today to evaluate for viability. Threatened miscarriage.  Patient was given bleeding precautions. [facility] worsening of symptoms tonight.  After history, exam, and medical workup I feel the patient has been appropriately medically screened and is safe for discharge home. Pertinent diagnoses were discussed  with the patient. Patient was given return precautions.   Final Clinical Impressions(s) / ED Diagnoses   Final diagnoses:  Threatened miscarriage    New Prescriptions Discharge Medication List as of 12/14/2016 12:26 AM      I personally performed the services described in this documentation, which was scribed in my presence. The recorded information has been reviewed and is accurate.     Shon Batonourtney F Horton, MD 12/14/16 [phone]

## 2016-12-13 NOTE — ED Triage Notes (Signed)
Patient reports that she is [redacted] weeks pregnant and was seen yesterday for "spotting" the patient reports that today it is worse and the bleeding is "darker"

## 2016-12-14 ENCOUNTER — Other Ambulatory Visit (HOSPITAL_BASED_OUTPATIENT_CLINIC_OR_DEPARTMENT_OTHER): Payer: Self-pay | Admitting: Emergency Medicine

## 2016-12-14 ENCOUNTER — Ambulatory Visit (HOSPITAL_BASED_OUTPATIENT_CLINIC_OR_DEPARTMENT_OTHER)
Admit: 2016-12-14 | Discharge: 2016-12-14 | Disposition: A | Discharge status: Home / Self Care | Payer: Self-pay | Attending: Emergency Medicine | Admitting: Emergency Medicine

## 2016-12-14 ENCOUNTER — Encounter (HOSPITAL_BASED_OUTPATIENT_CLINIC_OR_DEPARTMENT_OTHER): Payer: Self-pay

## 2016-12-14 DIAGNOSIS — O3481 Maternal care for other abnormalities of pelvic organs, first trimester: Secondary | ICD-10-CM | POA: Insufficient documentation

## 2016-12-14 DIAGNOSIS — Z3A08 8 weeks gestation of pregnancy: Secondary | ICD-10-CM | POA: Insufficient documentation

## 2016-12-14 DIAGNOSIS — O200 Threatened abortion: Secondary | ICD-10-CM | POA: Insufficient documentation

## 2016-12-14 DIAGNOSIS — N939 Abnormal uterine and vaginal bleeding, unspecified: Secondary | ICD-10-CM

## 2016-12-14 DIAGNOSIS — O208 Other hemorrhage in early pregnancy: Secondary | ICD-10-CM | POA: Insufficient documentation

## 2016-12-14 NOTE — Discharge Instructions (Signed)
You were seen today for threatened miscarriage. You do have bleeding on your exam but your cervical os is closed. Return later today for formal ultrasound. If you begin to bleed more than 1 pad an hour, have increasing pain or any worsening symptoms she needs to be reevaluated. [facility] if symptoms worsen overnight.

## 2016-12-16 LAB — GC/CHLAMYDIA PROBE AMP (~~LOC~~) NOT AT ARMC
Chlamydia: NEGATIVE
NEISSERIA GONORRHEA: NEGATIVE

## 2018-11-23 IMAGING — US US OB TRANSVAGINAL
1 series · 14 of 28 positions shown · non-contrast
Comparison: None.

CLINICAL DATA: Vaginal spotting for 1 day with midline pressure

EXAM:
OBSTETRIC <14 WK US AND TRANSVAGINAL OB US
TECHNIQUE: Both transabdominal and transvaginal ultrasound examinations were
performed for complete evaluation of the gestation as well as the
maternal uterus, adnexal regions, and pelvic cul-de-sac.
Transvaginal technique was performed to assess early pregnancy.

[Series 1: us ob transvaginal · 0.18mm/px · 86 acquisitions, 14 frames shown]
[im 4/86]
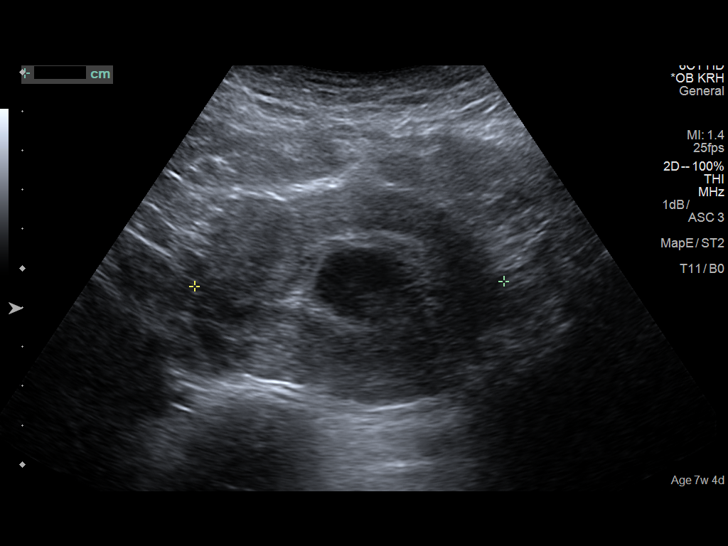
[im 10/86]
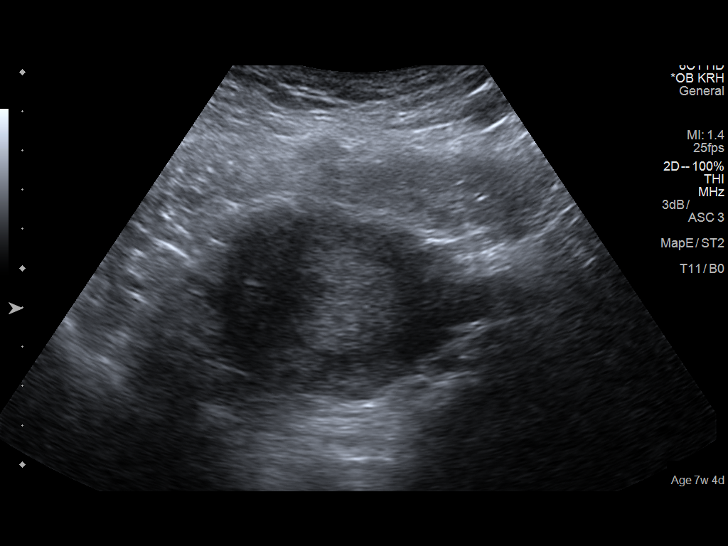
[im 16/86]
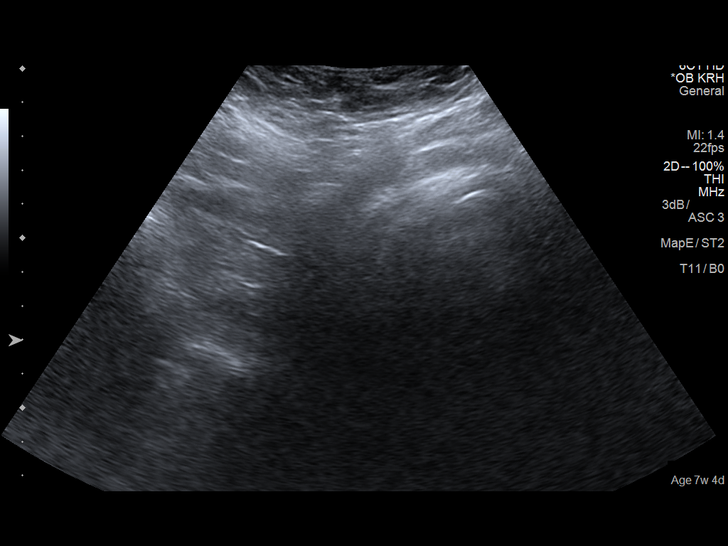
[im 23/86]
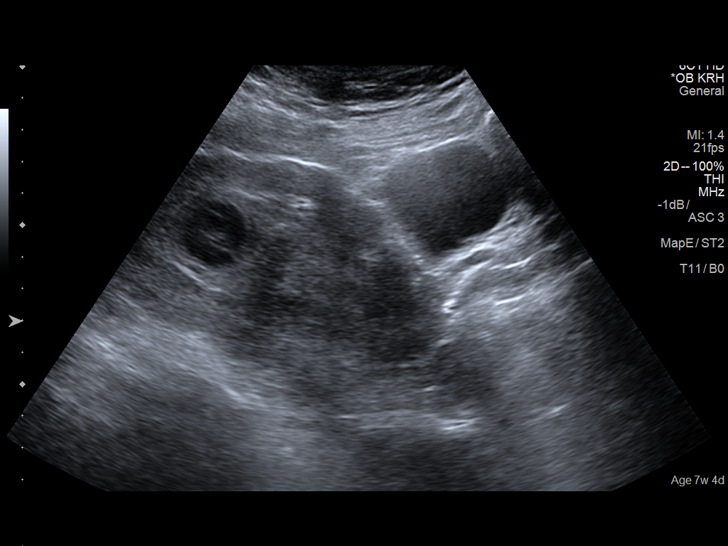
[im 29/86]
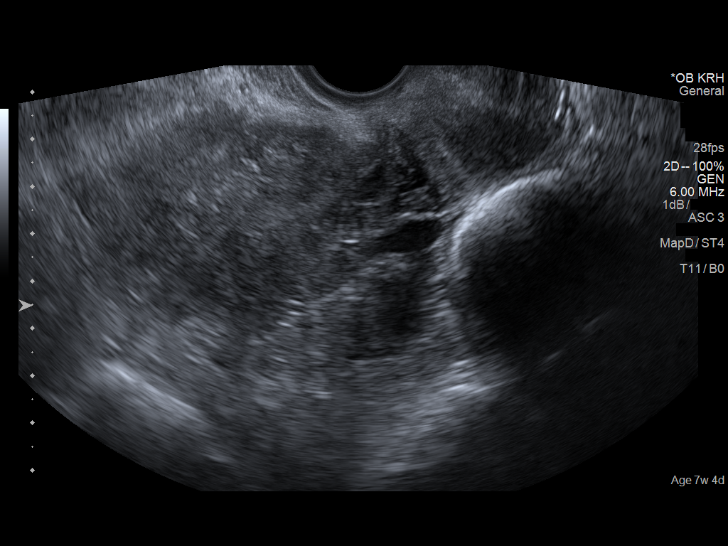
[im 35/86]
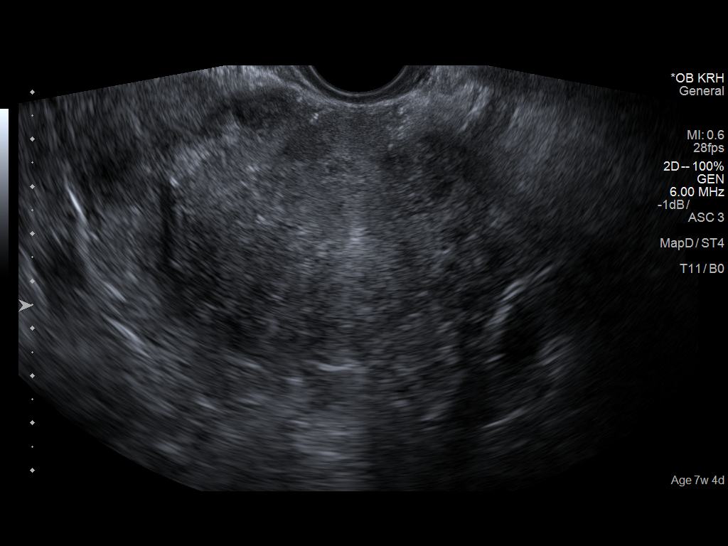
[im 41/86]
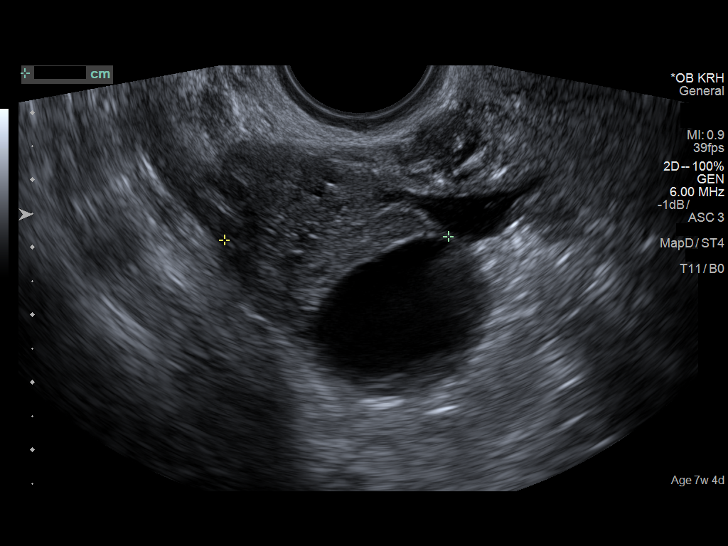
[im 48/86]
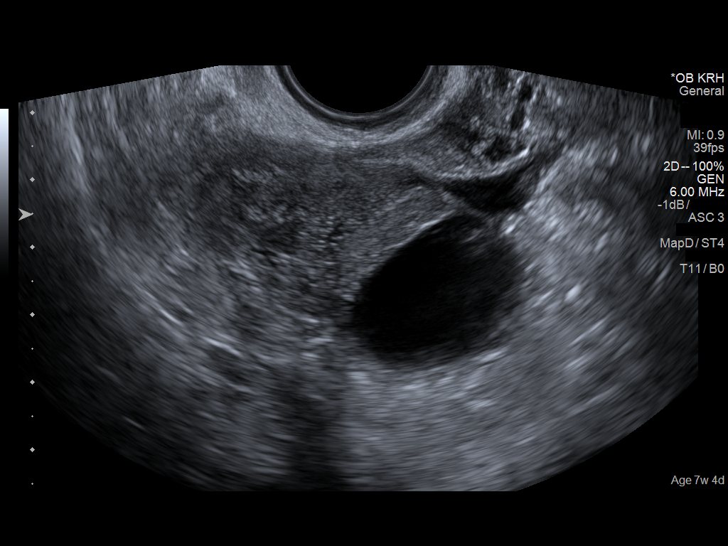
[im 54/86]
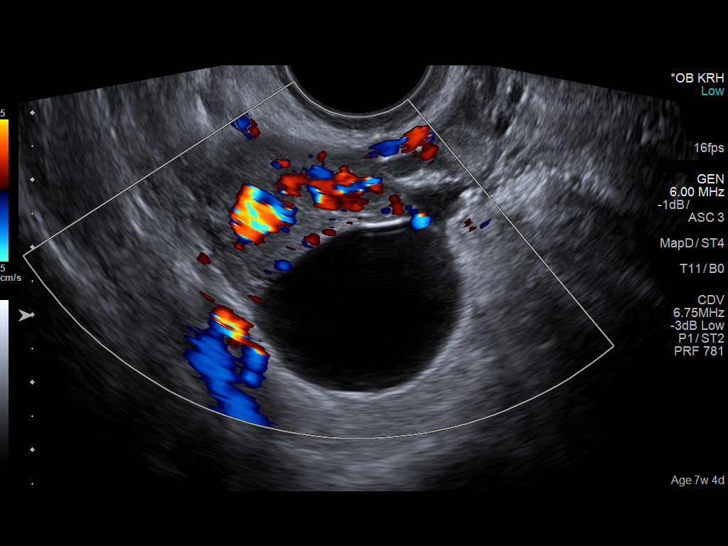
[im 60/86]
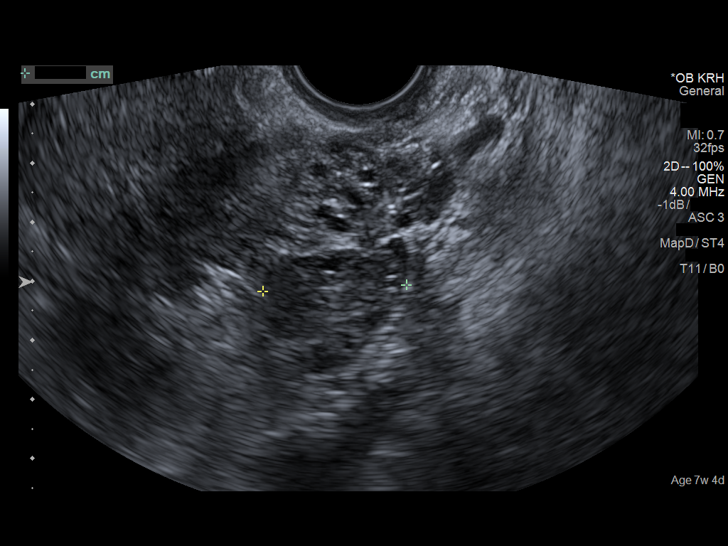
[im 67/86]
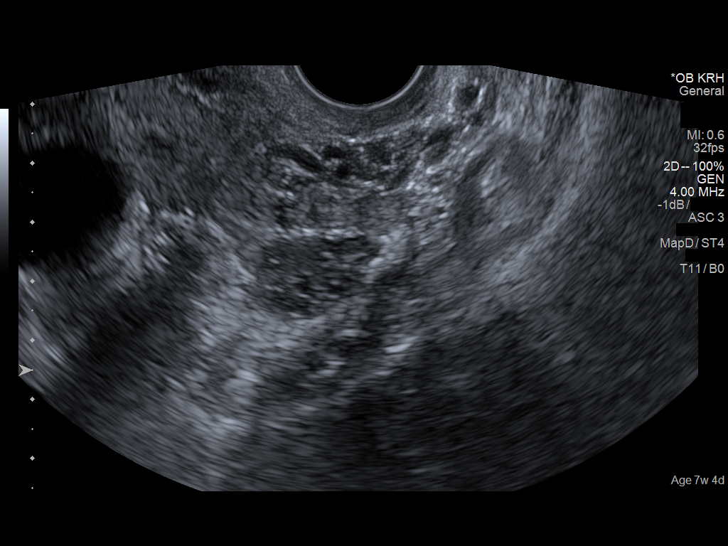
[im 73/86]
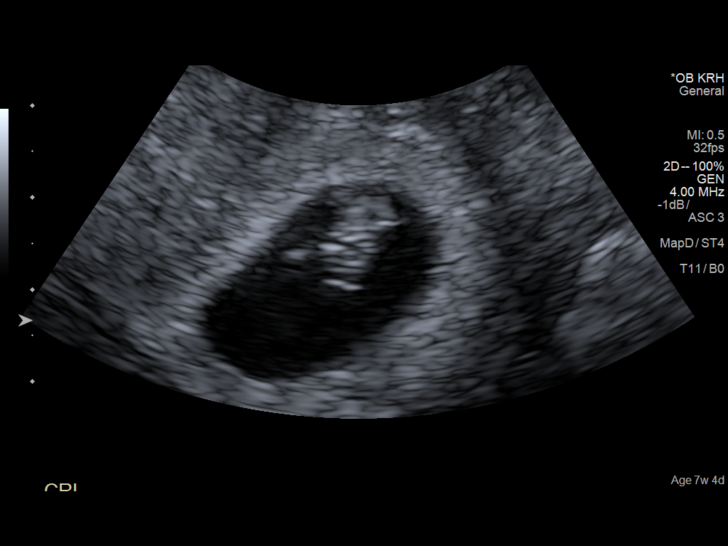
[im 79/86]
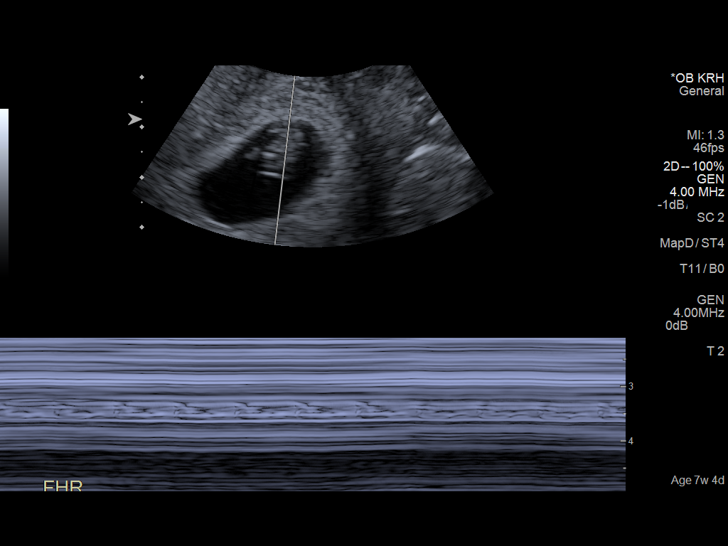
[im 86/86]
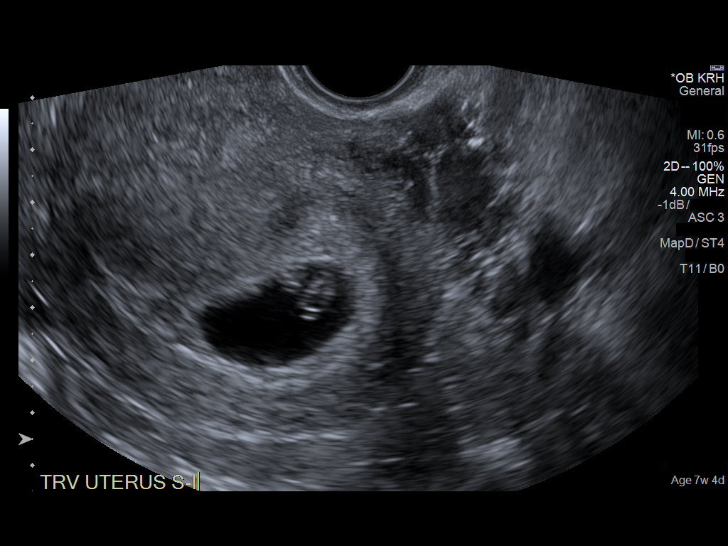

[14 of 28 positions shown; findings below may reference images not displayed]

FINDINGS: Intrauterine gestational sac: Single intrauterine gestation

Yolk sac:  Visualized

Embryo:  Visualized

Cardiac Activity: Visualized

Heart Rate: 158  bpm

CRL:  11.5  mm   7 w   2 d                  US EDC: 07/29/2017

Subchorionic hemorrhage:  None visualized.

Maternal uterus/adnexae: Left ovary is within normal limits and
measures 2.4 x 4.6 x 2 cm. The right ovary measures 3.3 x 5.1 x
cm. Sonolucent cyst in the right ovary measuring 2.5 x 2.9 x 2.9 cm.
No free fluid.
IMPRESSION: Single intrauterine gestation with fetal cardiac activity as above.
Otherwise negative examination.

## 2019-08-23 ENCOUNTER — Encounter (HOSPITAL_BASED_OUTPATIENT_CLINIC_OR_DEPARTMENT_OTHER): Payer: Self-pay | Admitting: *Deleted

## 2019-08-23 ENCOUNTER — Emergency Department (HOSPITAL_BASED_OUTPATIENT_CLINIC_OR_DEPARTMENT_OTHER)
Admission: EM | Admit: 2019-08-23 | Discharge: 2019-08-23 | Disposition: A | Discharge status: Home / Self Care | Payer: Self-pay | Attending: Emergency Medicine | Admitting: Emergency Medicine

## 2019-08-23 ENCOUNTER — Other Ambulatory Visit: Payer: Self-pay

## 2019-08-23 DIAGNOSIS — Z3202 Encounter for pregnancy test, result negative: Secondary | ICD-10-CM | POA: Insufficient documentation

## 2019-08-23 DIAGNOSIS — N12 Tubulo-interstitial nephritis, not specified as acute or chronic: Secondary | ICD-10-CM | POA: Insufficient documentation

## 2019-08-23 DIAGNOSIS — Z79899 Other long term (current) drug therapy: Secondary | ICD-10-CM | POA: Insufficient documentation

## 2019-08-23 LAB — COMPREHENSIVE METABOLIC PANEL
ALT: 18 U/L (ref 0–44)
AST: 20 U/L (ref 15–41)
Albumin: 3.5 g/dL (ref 3.5–5.0)
Alkaline Phosphatase: 69 U/L (ref 38–126)
Anion gap: 12 (ref 5–15)
BUN: 9 mg/dL (ref 6–20)
CO2: 20 mmol/L — ABNORMAL LOW (ref 22–32)
Calcium: 8.6 mg/dL — ABNORMAL LOW (ref 8.9–10.3)
Chloride: 103 mmol/L (ref 98–111)
Creatinine, Ser: 0.94 mg/dL (ref 0.44–1.00)
GFR calc Af Amer: >60 mL/min (ref >60)
GFR calc non Af Amer: >60 mL/min (ref >60)
Glucose, Bld: 99 mg/dL (ref 70–99)
Potassium: 3.1 mmol/L — ABNORMAL LOW (ref 3.5–5.1)
Sodium: 135 mmol/L (ref 135–145)
Total Bilirubin: 0.6 mg/dL (ref 0.3–1.2)
Total Protein: 7.7 g/dL (ref 6.5–8.1)

## 2019-08-23 LAB — URINALYSIS, ROUTINE W REFLEX MICROSCOPIC
Bilirubin Urine: NEGATIVE
Glucose, UA: NEGATIVE mg/dL
Ketones, ur: 40 mg/dL — AB
Nitrite: POSITIVE — AB
Protein, ur: 30 mg/dL — AB
Specific Gravity, Urine: 1.02 (ref 1.005–1.030)
pH: 6 (ref 5.0–8.0)

## 2019-08-23 LAB — CBC WITH DIFFERENTIAL/PLATELET
Abs Immature Granulocytes: 0.05 10*3/uL (ref 0.00–0.07)
Basophils Absolute: 0 10*3/uL (ref 0.0–0.1)
Basophils Relative: 0 %
Eosinophils Absolute: 0 10*3/uL (ref 0.0–0.5)
Eosinophils Relative: 0 %
HCT: 41.5 % (ref 36.0–46.0)
Hemoglobin: 13.2 g/dL (ref 12.0–15.0)
Immature Granulocytes: 1 %
Lymphocytes Relative: 14 %
Lymphs Abs: 1.4 10*3/uL (ref 0.7–4.0)
MCH: 29.1 pg (ref 26.0–34.0)
MCHC: 31.8 g/dL (ref 30.0–36.0)
MCV: 91.4 fL (ref 80.0–100.0)
Monocytes Absolute: 1.3 10*3/uL — ABNORMAL HIGH (ref 0.1–1.0)
Monocytes Relative: 12 %
Neutro Abs: 7.8 10*3/uL — ABNORMAL HIGH (ref 1.7–7.7)
Neutrophils Relative %: 73 %
Platelets: 268 10*3/uL (ref 150–400)
RBC: 4.54 MIL/uL (ref 3.87–5.11)
RDW: 14 % (ref 11.5–15.5)
WBC: 10.7 10*3/uL — ABNORMAL HIGH (ref 4.0–10.5)
nRBC: 0 % (ref 0.0–0.2)

## 2019-08-23 LAB — PREGNANCY, URINE: Preg Test, Ur: NEGATIVE

## 2019-08-23 LAB — URINALYSIS, MICROSCOPIC (REFLEX)
RBC / HPF: >50 RBC/hpf (ref 0–5)
WBC, UA: >50 WBC/hpf (ref 0–5)

## 2019-08-23 LAB — LIPASE, BLOOD: Lipase: 18 U/L (ref 11–51)

## 2019-08-23 MED ORDER — ACETAMINOPHEN 500 MG PO TABS
1000.0000 mg | ORAL_TABLET | Freq: Once | ORAL | Status: AC
  Administered 2019-08-23: 1000 mg via ORAL
  Filled 2019-08-23: qty 2

## 2019-08-23 MED ORDER — ONDANSETRON HCL 4 MG/2ML IJ SOLN
4.0000 mg | Freq: Once | INTRAMUSCULAR | Status: AC
  Administered 2019-08-23: 4 mg via INTRAVENOUS
  Filled 2019-08-23: qty 2

## 2019-08-23 MED ORDER — ONDANSETRON 4 MG PO TBDP: ORAL_TABLET | ORAL | 0 refills | Status: AC

## 2019-08-23 MED ORDER — CEPHALEXIN 500 MG PO CAPS: 500.0000 mg | ORAL_CAPSULE | Freq: Four times a day (QID) | ORAL | 0 refills | Status: AC

## 2019-08-23 MED ORDER — KETOROLAC TROMETHAMINE 15 MG/ML IJ SOLN
15.0000 mg | Freq: Once | INTRAMUSCULAR | Status: AC
  Administered 2019-08-23: 15 mg via INTRAVENOUS
  Filled 2019-08-23: qty 1

## 2019-08-23 MED ORDER — SODIUM CHLORIDE 0.9 % IV BOLUS
1000.0000 mL | Freq: Once | INTRAVENOUS | Status: AC
  Administered 2019-08-23: 1000 mL via INTRAVENOUS

## 2019-08-23 NOTE — ED Triage Notes (Signed)
Pt c/o diffuse abd pain with n/v  x 4 days LMP 7/21

## 2019-08-23 NOTE — ED Provider Notes (Signed)
[facility] EMERGENCY DEPARTMENT Provider Note   CSN: [id number] Arrival date & time: 08/23/19  1824     History   Chief Complaint Chief Complaint  Patient presents with  . Abdominal Pain    HPI Kari Reed is a 33 y.o. female.     33 yo F with a chief complaints of diffuse abdominal pain.  Going on for the past few days.  Describes a fever as well.  Had some difficulty urinating and so went to the pharmacy and was able to purchase the medicine over-the-counter.  Has been able to urinate.  Describing bilateral flank pain as well.  Diffuse myalgias.  No cough or congestion.  No vaginal bleeding or discharge.  Has had a couple episodes of nausea and vomiting.  The history is provided by the patient.  Abdominal Pain Pain location:  Generalized Pain quality: aching and cramping   Pain radiates to:  Does not radiate Pain severity:  Moderate Onset quality:  Gradual Duration:  2 days Timing:  Constant Progression:  Worsening Chronicity:  New Relieved by:  Nothing Worsened by:  Nothing Ineffective treatments:  None tried Associated symptoms: nausea and vomiting   Associated symptoms: no chest pain, no chills, no dysuria, no fever and no shortness of breath     History reviewed. No pertinent past medical history.  There are no active problems to display for this patient.   Past Surgical History:  Procedure Laterality Date  . CESAREAN SECTION  2011, 2012, 2015  . TUBAL LIGATION       OB History    Gravida  1   Para      Term      Preterm      AB      Living        SAB      TAB      Ectopic      Multiple      Live Births               Home Medications    Prior to Admission medications   Medication Sig Start Date End Date Taking? Authorizing Provider  ibuprofen (ADVIL) 400 MG tablet Take 400 mg by mouth every 6 (six) hours as needed.   Yes [provider]  cephALEXin (KEFLEX) 500 MG capsule Take 1 capsule (500 mg total) by  mouth 4 (four) times daily. 08/23/19   Melene Plan, DO  naproxen (NAPROSYN) 250 MG tablet Take 1 tablet (250 mg total) by mouth 2 (two) times daily with a meal. 05/25/16   Everlene Farrier, PA-C  ondansetron (ZOFRAN ODT) 4 MG disintegrating tablet 4mg  ODT q4 hours prn nausea/vomit 08/23/19   Melene Plan, DO    Family History No family history on file.  Social History Social History   Tobacco Use  . Smoking status: Current Every Day Smoker    Packs/day: 0.50  . Smokeless tobacco: Never Used  Substance Use Topics  . Alcohol use: No  . Drug use: No     Allergies   Patient has no known allergies.   Review of Systems Review of Systems  Constitutional: Negative for chills and fever.  HENT: Negative for congestion and rhinorrhea.   Eyes: Negative for redness and visual disturbance.  Respiratory: Negative for shortness of breath and wheezing.   Cardiovascular: Negative for chest pain and palpitations.  Gastrointestinal: Positive for abdominal pain, nausea and vomiting.  Genitourinary: Negative for dysuria and urgency.  Musculoskeletal: Negative for arthralgias and  myalgias.  Skin: Negative for pallor and wound.  Neurological: Negative for dizziness and headaches.     Physical Exam Updated Vital Signs BP 130/71 (BP Location: Left Arm)   Pulse (!) 101   Temp 99.6 F (37.6 C) (Oral)   Resp 18   Ht 5\' 5"  (1.651 m)   Wt 102.1 kg   LMP 07/12/2019   SpO2 99%   BMI 37.44 kg/m   Physical Exam Vitals signs and nursing note reviewed.  Constitutional:      General: She is not in acute distress.    Appearance: She is well-developed. She is not diaphoretic.  HENT:     Head: Normocephalic and atraumatic.  Eyes:     Pupils: Pupils are equal, round, and reactive to light.  Neck:     Musculoskeletal: Normal range of motion and neck supple.  Cardiovascular:     Rate and Rhythm: Normal rate and regular rhythm.     Heart sounds: No murmur. No friction rub. No gallop.   Pulmonary:      Effort: Pulmonary effort is normal.     Breath sounds: No wheezing or rales.  Abdominal:     General: There is no distension.     Palpations: Abdomen is soft.     Tenderness: There is abdominal tenderness.     Comments: Diffuse abdominal tenderness worse to the epigastrium and left upper quadrant.  She does have left-sided CVA tenderness.  Musculoskeletal:        General: No tenderness.  Skin:    General: Skin is warm and dry.  Neurological:     Mental Status: She is alert and oriented to person, place, and time.  Psychiatric:        Behavior: Behavior normal.      ED Treatments / Results  Labs (all labs ordered are listed, but only abnormal results are displayed) Labs Reviewed  URINALYSIS, ROUTINE W REFLEX MICROSCOPIC - Abnormal; Notable for the following components:      Result Value   APPearance CLOUDY (*)    Hgb urine dipstick MODERATE (*)    Ketones, ur 40 (*)    Protein, ur 30 (*)    Nitrite POSITIVE (*)    Leukocytes,Ua MODERATE (*)    All other components within normal limits  URINALYSIS, MICROSCOPIC (REFLEX) - Abnormal; Notable for the following components:   Bacteria, UA MANY (*)    All other components within normal limits  CBC WITH DIFFERENTIAL/PLATELET - Abnormal; Notable for the following components:   WBC 10.7 (*)    Neutro Abs 7.8 (*)    Monocytes Absolute 1.3 (*)    All other components within normal limits  COMPREHENSIVE METABOLIC PANEL - Abnormal; Notable for the following components:   Potassium 3.1 (*)    CO2 20 (*)    Calcium 8.6 (*)    All other components within normal limits  URINE CULTURE  PREGNANCY, URINE  LIPASE, BLOOD    EKG None  Radiology No results found.  Procedures Procedures (including critical care time)  Medications Ordered in ED Medications  acetaminophen (TYLENOL) tablet 1,000 mg (1,000 mg Oral Given 08/23/19 1835)  ketorolac (TORADOL) 15 MG/ML injection 15 mg (15 mg Intravenous Given 08/23/19 2024)  sodium chloride 0.9  % bolus 1,000 mL (0 mLs Intravenous Stopped 08/23/19 2055)  ondansetron (ZOFRAN) injection 4 mg (4 mg Intravenous Given 08/23/19 2024)     Initial Impression / Assessment and Plan / ED Course  I have reviewed the triage vital signs  and the nursing notes.  Pertinent labs & imaging results that were available during my care of the patient were reviewed by me and considered in my medical decision making (see chart for details).        33 yo F with a cc of L flank pain, abdominal pain, fever.  Her urine does look infected though it might be contaminated.  She is not describing discrete urinary symptoms.  Her pain seems more upper though she does have CVA tenderness.  Will obtain abdominal labs give a bolus of fluids and then reassess.  Labwork otherwise unremarkable.  No noted lft elevation.  Mild leukocytosis.  Will treat as pyelonephritis.  PCP follow-up.  10:37 PM:  I have discussed the diagnosis/risks/treatment options with the patient and believe the pt to be eligible for discharge home to follow-up with PCP. We also discussed returning to the ED immediately if new or worsening sx occur. We discussed the sx which are most concerning (e.g., sudden worsening pain, fever, inability to tolerate by mouth) that necessitate immediate return. Medications administered to the patient during their visit and any new prescriptions provided to the patient are listed below.  Medications given during this visit Medications  acetaminophen (TYLENOL) tablet 1,000 mg (1,000 mg Oral Given 08/23/19 1835)  ketorolac (TORADOL) 15 MG/ML injection 15 mg (15 mg Intravenous Given 08/23/19 2024)  sodium chloride 0.9 % bolus 1,000 mL (0 mLs Intravenous Stopped 08/23/19 2055)  ondansetron (ZOFRAN) injection 4 mg (4 mg Intravenous Given 08/23/19 2024)     The patient appears reasonably screen and/or stabilized for discharge and I doubt any other medical condition or other [facility] requiring further screening, evaluation, or treatment  in the ED at this time prior to discharge.    Final Clinical Impressions(s) / ED Diagnoses   Final diagnoses:  Pyelonephritis    ED Discharge Orders         Ordered    cephALEXin (KEFLEX) 500 MG capsule  4 times daily     08/23/19 2048    ondansetron (ZOFRAN ODT) 4 MG disintegrating tablet     08/23/19 2048           Melene PlanFloyd, Hadessah Grennan, DO 08/23/19 2238

## 2019-08-23 NOTE — Discharge Instructions (Signed)
Take 4 over the counter ibuprofen tablets 3 times a day or 2 over-the-counter naproxen tablets twice a day for pain. Also take tylenol 1000mg (2 extra strength) four times a day.   Return for worsening symptoms or inability to eat or drink.  Take antibiotics as prescribed.

## 2019-08-25 LAB — URINE CULTURE: Culture: >=100000 — AB

## 2019-08-26 ENCOUNTER — Telehealth: Payer: Self-pay

## 2019-08-26 NOTE — Telephone Encounter (Signed)
Post ED Visit - Positive Culture Follow-up  Culture report reviewed by antimicrobial stewardship pharmacist: [place] Team []  Elenor Quinones, Pharm.D. []  Heide Guile, Pharm.D., BCPS AQ-ID []  Parks Neptune, Pharm.D., BCPS []  Alycia Rossetti, Pharm.D., BCPS []  [place], Pharm.D., BCPS, AAHIVP []  Legrand Como, Pharm.D., BCPS, AAHIVP []  Salome Arnt, PharmD, BCPS []  Johnnette Gourd, PharmD, BCPS []  Hughes Better, PharmD, BCPS []  Leeroy Cha, PharmD []  Laqueta Linden, PharmD, BCPS []  Albertina Parr, PharmD [place] Team []  Leodis Sias, PharmD []  Lindell Spar, PharmD []  Royetta Asal, PharmD []  Graylin Shiver, Rph []  Rema Fendt) Glennon Mac, PharmD []  Arlyn Dunning, PharmD []  Netta Cedars, PharmD []  Dia Sitter, PharmD []  Leone Haven, PharmD []  Gretta Arab, PharmD []  Theodis Shove, PharmD []  Peggyann Juba, PharmD []  Reuel Boom, PharmD   Positive urine culture Treated with Cephalexin, organism sensitive to the same and no further patient follow-up is required at this time.  Genia Del 08/26/2019, 9:35 AM

## 2022-01-02 ENCOUNTER — Other Ambulatory Visit: Payer: Self-pay

## 2022-01-02 ENCOUNTER — Other Ambulatory Visit (HOSPITAL_BASED_OUTPATIENT_CLINIC_OR_DEPARTMENT_OTHER): Payer: Self-pay

## 2022-01-02 ENCOUNTER — Encounter (HOSPITAL_BASED_OUTPATIENT_CLINIC_OR_DEPARTMENT_OTHER): Payer: Self-pay

## 2022-01-02 ENCOUNTER — Emergency Department (HOSPITAL_BASED_OUTPATIENT_CLINIC_OR_DEPARTMENT_OTHER): Payer: Medicaid Other

## 2022-01-02 ENCOUNTER — Emergency Department (HOSPITAL_BASED_OUTPATIENT_CLINIC_OR_DEPARTMENT_OTHER)
Admission: EM | Admit: 2022-01-02 | Discharge: 2022-01-02 | Disposition: A | Discharge status: Home / Self Care | Payer: Medicaid Other | Attending: Emergency Medicine | Admitting: Emergency Medicine

## 2022-01-02 DIAGNOSIS — F17210 Nicotine dependence, cigarettes, uncomplicated: Secondary | ICD-10-CM | POA: Diagnosis not present

## 2022-01-02 DIAGNOSIS — K2950 Unspecified chronic gastritis without bleeding: Secondary | ICD-10-CM | POA: Diagnosis not present

## 2022-01-02 DIAGNOSIS — R1033 Periumbilical pain: Secondary | ICD-10-CM | POA: Diagnosis present

## 2022-01-02 LAB — COMPREHENSIVE METABOLIC PANEL
ALT: 11 U/L (ref 0–44)
AST: 21 U/L (ref 15–41)
Albumin: 3.4 g/dL — ABNORMAL LOW (ref 3.5–5.0)
Alkaline Phosphatase: 56 U/L (ref 38–126)
Anion gap: 8 (ref 5–15)
BUN: 10 mg/dL (ref 6–20)
CO2: 25 mmol/L (ref 22–32)
Calcium: 8.3 mg/dL — ABNORMAL LOW (ref 8.9–10.3)
Chloride: 104 mmol/L (ref 98–111)
Creatinine, Ser: 0.83 mg/dL (ref 0.44–1.00)
GFR, Estimated: >60 mL/min (ref >60)
Glucose, Bld: 92 mg/dL (ref 70–99)
Potassium: 3.6 mmol/L (ref 3.5–5.1)
Sodium: 137 mmol/L (ref 135–145)
Total Bilirubin: 0.4 mg/dL (ref 0.3–1.2)
Total Protein: 7 g/dL (ref 6.5–8.1)

## 2022-01-02 LAB — CBC WITH DIFFERENTIAL/PLATELET
Abs Immature Granulocytes: 0.01 10*3/uL (ref 0.00–0.07)
Basophils Absolute: 0 10*3/uL (ref 0.0–0.1)
Basophils Relative: 1 %
Eosinophils Absolute: 0.1 10*3/uL (ref 0.0–0.5)
Eosinophils Relative: 1 %
HCT: 36.4 % (ref 36.0–46.0)
Hemoglobin: 11.3 g/dL — ABNORMAL LOW (ref 12.0–15.0)
Immature Granulocytes: 0 %
Lymphocytes Relative: 39 %
Lymphs Abs: 2 10*3/uL (ref 0.7–4.0)
MCH: 27.6 pg (ref 26.0–34.0)
MCHC: 31 g/dL (ref 30.0–36.0)
MCV: 89 fL (ref 80.0–100.0)
Monocytes Absolute: 0.6 10*3/uL (ref 0.1–1.0)
Monocytes Relative: 13 %
Neutro Abs: 2.4 10*3/uL (ref 1.7–7.7)
Neutrophils Relative %: 46 %
Platelets: 282 10*3/uL (ref 150–400)
RBC: 4.09 MIL/uL (ref 3.87–5.11)
RDW: 17.9 % — ABNORMAL HIGH (ref 11.5–15.5)
WBC: 5.1 10*3/uL (ref 4.0–10.5)
nRBC: 0 % (ref 0.0–0.2)

## 2022-01-02 LAB — LIPASE, BLOOD: Lipase: 24 U/L (ref 11–51)

## 2022-01-02 MED ORDER — PANTOPRAZOLE SODIUM 20 MG PO TBEC
20.0000 mg | DELAYED_RELEASE_TABLET | Freq: Every day | ORAL | 0 refills | Status: AC
  Filled 2022-01-02: qty 14, 14d supply, fill #0

## 2022-01-02 MED ORDER — SUCRALFATE 1 G PO TABS
1.0000 g | ORAL_TABLET | Freq: Three times a day (TID) | ORAL | 0 refills | Status: AC
  Filled 2022-01-02: qty 56, 14d supply, fill #0

## 2022-01-02 MED ORDER — ACETAMINOPHEN 325 MG PO TABS
650.0000 mg | ORAL_TABLET | Freq: Four times a day (QID) | ORAL | Status: DC | PRN
  Administered 2022-01-02: 650 mg via ORAL
  Filled 2022-01-02: qty 2

## 2022-01-02 NOTE — ED Triage Notes (Signed)
Pt c/o intermittent chest pain, sob, and upper abdominal pain for the past couple of months.

## 2022-01-02 NOTE — ED Provider Notes (Signed)
I have personally seen and examined the patient. I have reviewed the documentation on PMH/FH/Soc Hx. I have discussed the plan of care with the resident and patient.  I have reviewed and agree with the resident's documentation. Please see associated encounter note.  Briefly, the patient is a 36 y.o. female here with abdominal pain.  On and off for the last several months.  Differential includes reflux versus fat-containing hernia versus pancreatitis versus cholecystitis.  Overall has very minimal discomfort now.  Palpated may be a small periumbilical hernia.  Otherwise exam is unremarkable.  Lab work was obtained and interpreted by myself that shows no significant anemia, electrolyte Gershon Mussel, kidney injury.  No leukocytosis.  Gallbladder liver enzymes within normal limits.  Doubt cholecystitis.  Lipase normal doubt pancreatitis.  We will start patient on reflux medications have her follow-up with primary care doctor.  No concern for bowel obstruction or other acute intra-abdominal process.  Understands return precautions.  This chart was dictated using voice recognition software.  Despite best efforts to proofread,  errors can occur which can change the documentation meaning.    EKG Interpretation  Date/Time:  Thursday January 02 2022 07:54:30 EST Ventricular Rate:  70 PR Interval:  197 QRS Duration: 95 QT Interval:  365 QTC Calculation: 394 R Axis:   75 Text Interpretation: Sinus rhythm Borderline repolarization abnormality Confirmed by Virgina Norfolk ([id number]) on 01/02/2022 7:58:27 AM          Virgina Norfolk, DO 01/02/22 2353

## 2022-01-02 NOTE — ED Provider Notes (Signed)
[facility] EMERGENCY DEPARTMENT Provider Note   CSN: [id number] Arrival date & time: 01/02/22  [id number]     History  Chief Complaint  Patient presents with   Abdominal Pain    Kari GreaserRegina Luevanos is a 36 y.o. female with no significant PMHx presents with abdominal pain that has been ongoing for the past couple months. She states the pain is intermittent 6/10 pain with no alleviating or aggravating factors. She denies fever, chills, CP, SOB, N/V, constipation, diarrhea, or urinary changes. She denies possibility of pregnancy, states she has her tubes tied. No recent falls or trauma to the abdomin.  Denies sick contacts. Pt smokes cigarettes, occasional alcohol use and denies illicit drug use.   Abdominal Pain Associated symptoms: no chest pain, no chills, no constipation, no diarrhea, no dysuria, no fever, no nausea, no shortness of breath and no vomiting   Chest Pain Associated symptoms: abdominal pain   Associated symptoms: no fever, no headache, no nausea, no shortness of breath and no vomiting       Home Medications Prior to Admission medications   Medication Sig Start Date End Date Taking? Authorizing Provider  ferrous sulfate 325 (65 FE) MG EC tablet Take 325 mg by mouth 3 (three) times daily with meals.   Yes [provider]  pantoprazole (PROTONIX) 20 MG tablet Take 1 tablet (20 mg total) by mouth daily for 14 days. 01/02/22 01/16/22 Yes Curatolo, Adam, DO  sucralfate (CARAFATE) 1 g tablet Take 1 tablet (1 g total) by mouth 4 (four) times daily -  with meals and at bedtime for 14 days. 01/02/22 01/16/22 Yes Curatolo, Adam, DO  cephALEXin (KEFLEX) 500 MG capsule Take 1 capsule (500 mg total) by mouth 4 (four) times daily. 08/23/19   Melene PlanFloyd, Dan, DO  ibuprofen (ADVIL) 400 MG tablet Take 400 mg by mouth every 6 (six) hours as needed.    [provider]  naproxen (NAPROSYN) 250 MG tablet Take 1 tablet (250 mg total) by mouth 2 (two) times daily with a meal. 05/25/16    Everlene Farrieransie, William, PA-C  ondansetron (ZOFRAN ODT) 4 MG disintegrating tablet 4mg  ODT q4 hours prn nausea/vomit 08/23/19   Melene PlanFloyd, Dan, DO      Allergies    Patient has no known allergies.    Review of Systems   Review of Systems  Constitutional:  Negative for chills and fever.  Respiratory:  Negative for shortness of breath.   Cardiovascular:  Negative for chest pain.  Gastrointestinal:  Positive for abdominal pain. Negative for constipation, diarrhea, nausea and vomiting.  Genitourinary:  Negative for dysuria, flank pain, frequency and menstrual problem.  Neurological:  Negative for light-headedness and headaches.   Physical Exam Updated Vital Signs BP (!) 155/92 (BP Location: Right Arm)    Pulse 69    Temp 98.2 F (36.8 C)    Resp 17    Ht 5\' 5"  (1.651 m)    Wt 83.5 kg    LMP 12/30/2021    SpO2 100%    BMI 30.62 kg/m  Physical Exam Constitutional:      General: She is not in acute distress. HENT:     Head: Normocephalic and atraumatic.  Cardiovascular:     Rate and Rhythm: Normal rate and regular rhythm.     Heart sounds: Normal heart sounds.  Pulmonary:     Effort: Pulmonary effort is normal.     Breath sounds: Normal breath sounds and air entry.  Abdominal:     General: Abdomen  is protuberant. Bowel sounds are normal.     Palpations: Abdomen is soft.     Tenderness: There is abdominal tenderness in the periumbilical area.     Hernia: A hernia is present. Hernia is present in the ventral area.  Musculoskeletal:     Right lower leg: No edema.     Left lower leg: No edema.  Skin:    General: Skin is warm and dry.  Neurological:     General: No focal deficit present.     Mental Status: She is alert.  Psychiatric:        Behavior: Behavior normal. Behavior is cooperative.        Cognition and Memory: Cognition normal.    ED Results / Procedures / Treatments   Labs (all labs ordered are listed, but only abnormal results are displayed) Labs Reviewed  CBC WITH  DIFFERENTIAL/PLATELET - Abnormal; Notable for the following components:      Result Value   Hemoglobin 11.3 (*)    RDW 17.9 (*)    All other components within normal limits  COMPREHENSIVE METABOLIC PANEL - Abnormal; Notable for the following components:   Calcium 8.3 (*)    Albumin 3.4 (*)    All other components within normal limits  LIPASE, BLOOD  PREGNANCY, URINE    EKG EKG Interpretation  Date/Time:  Thursday January 02 2022 07:54:30 EST Ventricular Rate:  70 PR Interval:  197 QRS Duration: 95 QT Interval:  365 QTC Calculation: 394 R Axis:   75 Text Interpretation: Sinus rhythm Borderline repolarization abnormality Confirmed by Virgina Norfolk ([id number]) on 01/02/2022 7:58:27 AM  Radiology DG Chest Portable 1 View  Result Date: 01/02/2022 CLINICAL DATA:  Pain EXAM: PORTABLE CHEST 1 VIEW COMPARISON:  June 2017 FINDINGS: The heart size and mediastinal contours are within normal limits. Both lungs are clear. No pleural effusion or pneumothorax. The visualized skeletal structures are unremarkable. IMPRESSION: No active disease. Electronically Signed   By: Guadlupe Spanish M.D.   On: 01/02/2022 08:20    Procedures Procedures    Medications Ordered in ED Medications  acetaminophen (TYLENOL) tablet 650 mg (650 mg Oral Given 01/02/22 0846)    ED Course/ Medical Decision Making/ A&P Clinical Course as of 01/02/22 0851  Thu Jan 02, 2022  0756 EKG 12-Lead [SA]    Clinical Course User Index [SA] Dellis Filbert, MD                           Medical Decision Making  Pt presents with ongoing abdominal pain for the past several months. No red flags for acute abdomin. Pt has Hx of cesarean section several years ago. Small palpable periumbilical mass appreciated abd exam; non-reducible. Likely fatty tissue contained hernia.  Low suspicion for infectious process; pt is afebrile and denies N/V or diarrhea. CXR negative; no air fluid levels noted. Pt in no acute distress. Initial labs, were  reassuring.Pt did endorse some burning sensation, pt likely experiencing gastritis and/or acid reflux. Given chronicity of abd pain, relatively benign physical exam, and unremarkable labs; non-concerning for life-threatening processes. Pt will benefit from outpatient work-up. Pt referred to provider at [facility] to establish PCP. Pt is stable for discharge. Pt discharged with Protonix and Carafate.         Final Clinical Impression(s) / ED Diagnoses Final diagnoses:  None    Rx / DC Orders ED Discharge Orders          Ordered  pantoprazole (PROTONIX) 20 MG tablet  Daily        01/02/22 0828    sucralfate (CARAFATE) 1 g tablet  3 times daily with meals & bedtime        01/02/22 6010              Dellis Filbert, MD 01/02/22 9323    Virgina Norfolk, DO 01/02/22 [phone]

## 2022-05-27 IMAGING — DX DG CHEST 1V PORT
1 series · 1 of 1 positions shown · non-contrast
Comparison: May 2016

CLINICAL DATA: Pain

EXAM:
PORTABLE CHEST 1 VIEW

[chest ap]
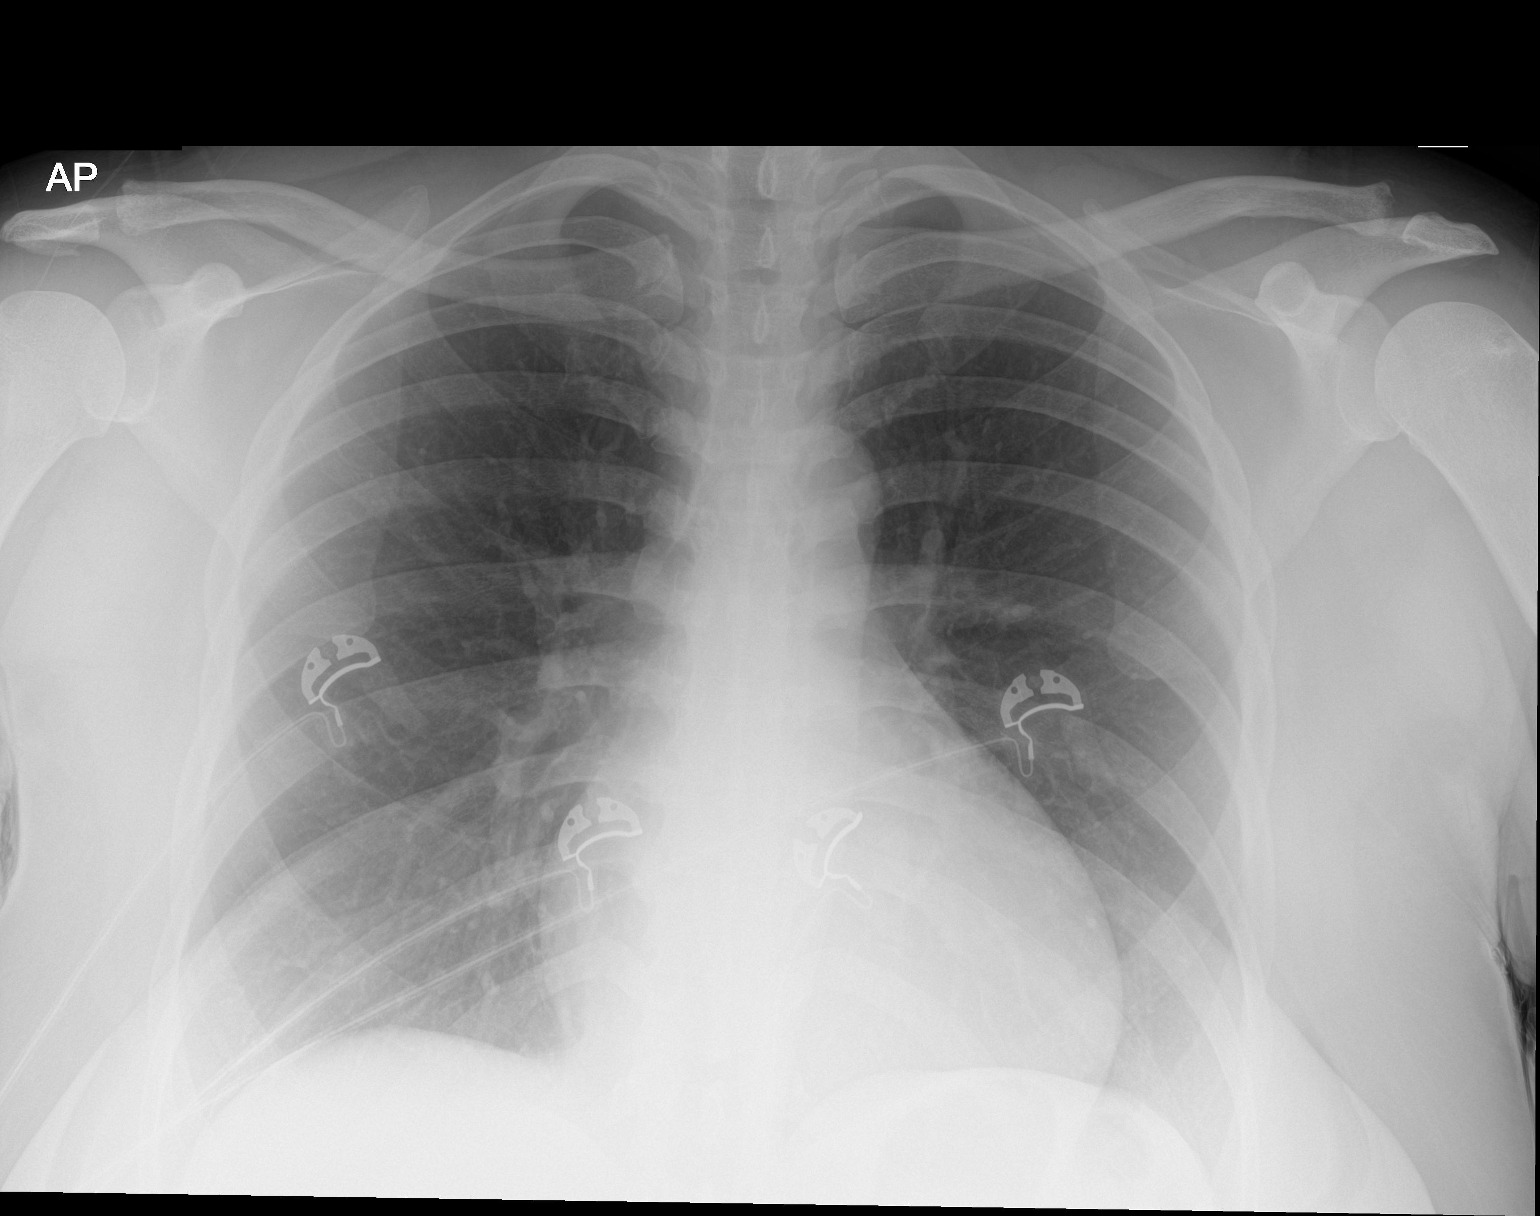

[1 of 1 positions shown; findings below may reference images not displayed]

FINDINGS: The heart size and mediastinal contours are within normal limits.
Both lungs are clear. No pleural effusion or pneumothorax. The
visualized skeletal structures are unremarkable.
IMPRESSION: No active disease.
